# Patient Record
Sex: Male | Born: 1955
Health system: Southern US, Community
[De-identification: ages and names within clinical notes are randomized; demographics above are authoritative.]

## PROBLEM LIST (undated history)

## (undated) DIAGNOSIS — C4491 Basal cell carcinoma of skin, unspecified: Secondary | ICD-10-CM

## (undated) DIAGNOSIS — L52 Erythema nodosum: Secondary | ICD-10-CM

## (undated) DIAGNOSIS — H16139 Photokeratitis, unspecified eye: Secondary | ICD-10-CM

## (undated) HISTORY — DX: Photokeratitis, unspecified eye: H16.139

## (undated) HISTORY — DX: Basal cell carcinoma of skin, unspecified: C44.91

## (undated) HISTORY — DX: Erythema nodosum: L52

## (undated) HISTORY — PX: REFRACTIVE SURGERY: SHX103

---

## 1999-09-14 ENCOUNTER — Ambulatory Visit (HOSPITAL_COMMUNITY): Admission: RE | Admit: 1999-09-14 | Discharge: 1999-09-14 | Payer: Self-pay | Admitting: Gastroenterology

## 2005-03-15 ENCOUNTER — Ambulatory Visit: Payer: Self-pay | Admitting: Internal Medicine

## 2005-03-27 ENCOUNTER — Ambulatory Visit: Payer: Self-pay | Admitting: Internal Medicine

## 2009-02-09 ENCOUNTER — Telehealth: Payer: Self-pay | Admitting: Internal Medicine

## 2009-03-09 ENCOUNTER — Telehealth (INDEPENDENT_AMBULATORY_CARE_PROVIDER_SITE_OTHER): Payer: Self-pay | Admitting: *Deleted

## 2009-03-17 ENCOUNTER — Ambulatory Visit: Payer: Self-pay | Admitting: Internal Medicine

## 2009-03-20 ENCOUNTER — Encounter: Payer: Self-pay | Admitting: Internal Medicine

## 2009-03-20 LAB — CONVERTED CEMR LAB
ALT: 19 units/L (ref 0–53)
AST: 32 units/L (ref 0–37)
Albumin: 4.4 g/dL (ref 3.5–5.2)
Alkaline Phosphatase: 63 units/L (ref 39–117)
BUN: 10 mg/dL (ref 6–23)
Basophils Absolute: 0.1 10*3/uL (ref 0.0–0.1)
Basophils Relative: 0.9 % (ref 0.0–3.0)
Bilirubin, Direct: 0.1 mg/dL (ref 0.0–0.3)
CO2: 31 meq/L (ref 19–32)
Calcium: 9.3 mg/dL (ref 8.4–10.5)
Chloride: 104 meq/L (ref 96–112)
Cholesterol: 167 mg/dL (ref 0–200)
Creatinine, Ser: 1.1 mg/dL (ref 0.4–1.5)
Eosinophils Absolute: 0.5 10*3/uL (ref 0.0–0.7)
Eosinophils Relative: 9.5 % — ABNORMAL HIGH (ref 0.0–5.0)
GFR calc non Af Amer: 74.63 mL/min (ref >60)
Glucose, Bld: 86 mg/dL (ref 70–99)
HCT: 41.8 % (ref 39.0–52.0)
HDL: 28.8 mg/dL — ABNORMAL LOW (ref >39.00)
Hemoglobin: 14.1 g/dL (ref 13.0–17.0)
LDL Cholesterol: 115 mg/dL — ABNORMAL HIGH (ref 0–99)
Lymphocytes Relative: 30.8 % (ref 12.0–46.0)
Lymphs Abs: 1.7 10*3/uL (ref 0.7–4.0)
MCHC: 33.7 g/dL (ref 30.0–36.0)
MCV: 89.2 fL (ref 78.0–100.0)
Monocytes Absolute: 0.5 10*3/uL (ref 0.1–1.0)
Monocytes Relative: 8.3 % (ref 3.0–12.0)
Neutro Abs: 2.8 10*3/uL (ref 1.4–7.7)
Neutrophils Relative %: 50.5 % (ref 43.0–77.0)
PSA: 0.46 ng/mL (ref 0.10–4.00)
Platelets: 226 10*3/uL (ref 150.0–400.0)
Potassium: 4.1 meq/L (ref 3.5–5.1)
RBC: 4.69 M/uL (ref 4.22–5.81)
RDW: 12 % (ref 11.5–14.6)
Sodium: 140 meq/L (ref 135–145)
TSH: 1.04 microintl units/mL (ref 0.35–5.50)
Total Bilirubin: 1 mg/dL (ref 0.3–1.2)
Total CHOL/HDL Ratio: 6
Total Protein: 7.5 g/dL (ref 6.0–8.3)
Triglycerides: 118 mg/dL (ref 0.0–149.0)
VLDL: 23.6 mg/dL (ref 0.0–40.0)
WBC: 5.6 10*3/uL (ref 4.5–10.5)

## 2009-03-24 ENCOUNTER — Ambulatory Visit: Payer: Self-pay | Admitting: Internal Medicine

## 2009-03-24 DIAGNOSIS — H9319 Tinnitus, unspecified ear: Secondary | ICD-10-CM | POA: Insufficient documentation

## 2009-03-27 ENCOUNTER — Encounter (INDEPENDENT_AMBULATORY_CARE_PROVIDER_SITE_OTHER): Payer: Self-pay | Admitting: *Deleted

## 2009-04-25 ENCOUNTER — Ambulatory Visit: Payer: Self-pay | Admitting: Internal Medicine

## 2009-04-26 LAB — CONVERTED CEMR LAB: Fecal Occult Bld: NEGATIVE

## 2009-04-27 ENCOUNTER — Encounter (INDEPENDENT_AMBULATORY_CARE_PROVIDER_SITE_OTHER): Payer: Self-pay | Admitting: *Deleted

## 2010-07-08 ENCOUNTER — Encounter: Payer: Self-pay | Admitting: Internal Medicine

## 2010-07-11 ENCOUNTER — Encounter: Payer: Self-pay | Admitting: Internal Medicine

## 2010-07-11 ENCOUNTER — Other Ambulatory Visit: Payer: Self-pay | Admitting: Internal Medicine

## 2010-07-11 ENCOUNTER — Ambulatory Visit
Admission: RE | Admit: 2010-07-11 | Discharge: 2010-07-11 | Payer: Self-pay | Source: Home / Self Care | Attending: Internal Medicine | Admitting: Internal Medicine

## 2010-07-11 DIAGNOSIS — R509 Fever, unspecified: Secondary | ICD-10-CM | POA: Insufficient documentation

## 2010-07-11 LAB — CBC WITH DIFFERENTIAL/PLATELET
Basophils Absolute: 0 10*3/uL (ref 0.0–0.1)
Basophils Relative: 0 % (ref 0.0–3.0)
Eosinophils Absolute: 0.2 10*3/uL (ref 0.0–0.7)
Eosinophils Relative: 1.6 % (ref 0.0–5.0)
HCT: 40.5 % (ref 39.0–52.0)
Hemoglobin: 13.4 g/dL (ref 13.0–17.0)
Lymphocytes Relative: 3.7 % — ABNORMAL LOW (ref 12.0–46.0)
Lymphs Abs: 0.6 10*3/uL — ABNORMAL LOW (ref 0.7–4.0)
MCHC: 33.2 g/dL (ref 30.0–36.0)
MCV: 91 fl (ref 78.0–100.0)
Monocytes Absolute: 1.6 10*3/uL — ABNORMAL HIGH (ref 0.1–1.0)
Monocytes Relative: 10 % (ref 3.0–12.0)
Neutro Abs: 13.3 10*3/uL — ABNORMAL HIGH (ref 1.4–7.7)
Neutrophils Relative %: 84.7 % — ABNORMAL HIGH (ref 43.0–77.0)
Platelets: 321 10*3/uL (ref 150.0–400.0)
RBC: 4.45 Mil/uL (ref 4.22–5.81)
RDW: 12.9 % (ref 11.5–14.6)
WBC: 15.7 10*3/uL — ABNORMAL HIGH (ref 4.5–10.5)

## 2010-07-11 LAB — CONVERTED CEMR LAB
Bilirubin Urine: NEGATIVE
Blood in Urine, dipstick: NEGATIVE
Glucose, Urine, Semiquant: NEGATIVE
Ketones, urine, test strip: NEGATIVE
Nitrite: NEGATIVE
Protein, U semiquant: NEGATIVE
Specific Gravity, Urine: >=1.03
Urobilinogen, UA: 0.2
WBC Urine, dipstick: NEGATIVE
pH: 6

## 2010-07-11 LAB — SEDIMENTATION RATE: Sed Rate: 42 mm/hr — ABNORMAL HIGH (ref 0–22)

## 2010-07-12 ENCOUNTER — Encounter: Payer: Self-pay | Admitting: Internal Medicine

## 2010-07-12 ENCOUNTER — Encounter: Payer: Self-pay | Admitting: Infectious Diseases

## 2010-07-12 ENCOUNTER — Ambulatory Visit
Admission: RE | Admit: 2010-07-12 | Discharge: 2010-07-12 | Payer: Self-pay | Source: Home / Self Care | Attending: Infectious Diseases | Admitting: Infectious Diseases

## 2010-07-12 LAB — CONVERTED CEMR LAB
ALT: 62 units/L — ABNORMAL HIGH (ref 0–53)
AST: 61 units/L — ABNORMAL HIGH (ref 0–37)
Albumin: 3.6 g/dL (ref 3.5–5.2)
Alkaline Phosphatase: 147 units/L — ABNORMAL HIGH (ref 39–117)
BUN: 14 mg/dL (ref 6–23)
Bacteria, UA: NONE SEEN
Basophils Absolute: 0 10*3/uL (ref 0.0–0.1)
Basophils Relative: 0 % (ref 0–1)
CMV IgM: 0.12 (ref <0.90)
CO2: 28 meq/L (ref 19–32)
Calcium: 8.6 mg/dL (ref 8.4–10.5)
Casts: NONE SEEN /lpf
Chloride: 96 meq/L (ref 96–112)
Creatinine, Ser: 0.93 mg/dL (ref 0.40–1.50)
Crystals: NONE SEEN
Cytomegalovirus Ab-IgG: 0.16 (ref <0.90)
Eosinophils Absolute: 0.6 10*3/uL (ref 0.0–0.7)
Eosinophils Relative: 3 % (ref 0–5)
Glucose, Bld: 115 mg/dL — ABNORMAL HIGH (ref 70–99)
HCT: 38.4 % — ABNORMAL LOW (ref 39.0–52.0)
Hemoglobin: 12.6 g/dL — ABNORMAL LOW (ref 13.0–17.0)
Lymphocytes Relative: 4 % — ABNORMAL LOW (ref 12–46)
Lymphs Abs: 0.7 10*3/uL (ref 0.7–4.0)
MCHC: 32.8 g/dL (ref 30.0–36.0)
MCV: 88.3 fL (ref 78.0–100.0)
Monocytes Absolute: 1.8 10*3/uL — ABNORMAL HIGH (ref 0.1–1.0)
Monocytes Relative: 10 % (ref 3–12)
Neutro Abs: 14.8 10*3/uL — ABNORMAL HIGH (ref 1.7–7.7)
Neutrophils Relative %: 83 % — ABNORMAL HIGH (ref 43–77)
Platelets: 380 10*3/uL (ref 150–400)
Potassium: 4.4 meq/L (ref 3.5–5.3)
RBC: 4.35 M/uL (ref 4.22–5.81)
RDW: 13 % (ref 11.5–15.5)
Sodium: 135 meq/L (ref 135–145)
Squamous Epithelial / LPF: NONE SEEN /lpf
Total Bilirubin: 0.7 mg/dL (ref 0.3–1.2)
Total Protein: 6.5 g/dL (ref 6.0–8.3)
Toxoplasma Antibody- IgM: 0.2
Toxoplasma IgG Ratio: <0.5
WBC: 17.9 10*3/uL — ABNORMAL HIGH (ref 4.0–10.5)

## 2010-07-15 LAB — CONVERTED CEMR LAB
Anti Nuclear Antibody(ANA): NEGATIVE
Rhuematoid fact SerPl-aCnc: 31 intl units/mL — ABNORMAL HIGH (ref <=14)

## 2010-07-18 ENCOUNTER — Telehealth: Payer: Self-pay | Admitting: Infectious Diseases

## 2010-07-19 ENCOUNTER — Ambulatory Visit
Admission: RE | Admit: 2010-07-19 | Discharge: 2010-07-19 | Payer: Self-pay | Source: Home / Self Care | Attending: Infectious Diseases | Admitting: Infectious Diseases

## 2010-07-19 ENCOUNTER — Encounter: Payer: Self-pay | Admitting: Infectious Diseases

## 2010-07-19 ENCOUNTER — Encounter: Payer: Self-pay | Admitting: Internal Medicine

## 2010-07-19 ENCOUNTER — Inpatient Hospital Stay (HOSPITAL_COMMUNITY)
Admission: EM | Admit: 2010-07-19 | Discharge: 2010-07-24 | Disposition: A | Discharge status: Home / Self Care | Payer: BC Managed Care – PPO | Source: Home / Self Care | Attending: Internal Medicine | Admitting: Internal Medicine

## 2010-07-19 LAB — DIFFERENTIAL
Basophils Absolute: 0 10*3/uL (ref 0.0–0.1)
Basophils Relative: 0 % (ref 0–1)
Eosinophils Absolute: 1 10*3/uL — ABNORMAL HIGH (ref 0.0–0.7)
Eosinophils Relative: 4 % (ref 0–5)
Lymphocytes Relative: 4 % — ABNORMAL LOW (ref 12–46)
Lymphs Abs: 1 10*3/uL (ref 0.7–4.0)
Monocytes Absolute: 2.6 10*3/uL — ABNORMAL HIGH (ref 0.1–1.0)
Monocytes Relative: 11 % (ref 3–12)
Neutro Abs: 19.4 10*3/uL — ABNORMAL HIGH (ref 1.7–7.7)
Neutrophils Relative %: 81 % — ABNORMAL HIGH (ref 43–77)

## 2010-07-19 LAB — COMPREHENSIVE METABOLIC PANEL
ALT: 49 U/L (ref 0–53)
AST: 49 U/L — ABNORMAL HIGH (ref 0–37)
Albumin: 2.3 g/dL — ABNORMAL LOW (ref 3.5–5.2)
Alkaline Phosphatase: 329 U/L — ABNORMAL HIGH (ref 39–117)
BUN: 11 mg/dL (ref 6–23)
CO2: 29 mEq/L (ref 19–32)
Calcium: 8.2 mg/dL — ABNORMAL LOW (ref 8.4–10.5)
Chloride: 101 mEq/L (ref 96–112)
Creatinine, Ser: 0.86 mg/dL (ref 0.4–1.5)
GFR calc Af Amer: >60 mL/min (ref >60)
GFR calc non Af Amer: >60 mL/min (ref >60)
Glucose, Bld: 91 mg/dL (ref 70–99)
Potassium: 4.5 mEq/L (ref 3.5–5.1)
Sodium: 141 mEq/L (ref 135–145)
Total Bilirubin: 0.4 mg/dL (ref 0.3–1.2)
Total Protein: 6.3 g/dL (ref 6.0–8.3)

## 2010-07-19 LAB — CK TOTAL AND CKMB (NOT AT ARMC)
CK, MB: 0.6 ng/mL (ref 0.3–4.0)
Relative Index: INVALID (ref 0.0–2.5)
Total CK: 21 U/L (ref 7–232)

## 2010-07-19 LAB — CBC
HCT: 35.1 % — ABNORMAL LOW (ref 39.0–52.0)
Hemoglobin: 11.4 g/dL — ABNORMAL LOW (ref 13.0–17.0)
MCH: 28.1 pg (ref 26.0–34.0)
MCHC: 32.5 g/dL (ref 30.0–36.0)
MCV: 86.5 fL (ref 78.0–100.0)
Platelets: 685 10*3/uL — ABNORMAL HIGH (ref 150–400)
RBC: 4.06 MIL/uL — ABNORMAL LOW (ref 4.22–5.81)
RDW: 14 % (ref 11.5–15.5)
WBC: 24 10*3/uL — ABNORMAL HIGH (ref 4.0–10.5)

## 2010-07-19 LAB — RHEUMATOID FACTOR: Rhuematoid fact SerPl-aCnc: 25 IU/mL — ABNORMAL HIGH (ref <=14)

## 2010-07-19 LAB — PROTIME-INR
INR: 1.27 (ref 0.00–1.49)
Prothrombin Time: 16.1 seconds — ABNORMAL HIGH (ref 11.6–15.2)

## 2010-07-19 LAB — APTT: aPTT: 31 seconds (ref 24–37)

## 2010-07-19 LAB — FOLATE: Folate: 8.1 ng/mL

## 2010-07-19 LAB — RPR: RPR Ser Ql: NONREACTIVE

## 2010-07-19 LAB — FERRITIN: Ferritin: 522 ng/mL — ABNORMAL HIGH (ref 22–322)

## 2010-07-19 LAB — VITAMIN B12: Vitamin B-12: 669 pg/mL (ref 211–911)

## 2010-07-19 LAB — ANTISTREPTOLYSIN O TITER: ASO: 55 IU/mL (ref 0–116)

## 2010-07-19 LAB — LACTATE DEHYDROGENASE: LDH: 159 U/L (ref 94–250)

## 2010-07-19 LAB — BRAIN NATRIURETIC PEPTIDE: Pro B Natriuretic peptide (BNP): 113 pg/mL — ABNORMAL HIGH (ref 0.0–100.0)

## 2010-07-19 NOTE — H&P (Addendum)
Jeremiah [name], Jeremiah [name]            ACCOUNT NO.:  [id number]  MEDICAL RECORD NO.:  [id number]          PATIENT TYPE:  INP  LOCATION:  2009                         FACILITY:  [facility]  PHYSICIAN:  Hartley Barefoot, MD    DATE OF BIRTH:  04-11-1956  DATE OF ADMISSION:  07/19/2010 DATE OF DISCHARGE:                             HISTORY & PHYSICAL   PRIMARY CARE PHYSICIAN:  Willow Ora, MD  INFECTIOUS DISEASES SPECIALIST:  Lacretia Leigh. Ninetta Lights, MD.  CHIEF COMPLAINT:  Fever, skin knot, and lower extremity swelling.  HISTORY OF PRESENT ILLNESS:  This is a very pleasant 55 year old with no significant past medical history who was referred for a direct admission by his infectious diseases specialist, Dr. Ninetta Lights due to fever for 2 weeks and lower extremity swelling.  Mr. Canavan relate that at the beginning of the month.  He started to have a knot on his head.  He felt a small induration painful, then over then course of 4 days, he developed multiple knots on his chest and right arm.  He also had  other on his right thigh.  He described the knot as induration, swelling, purplish, reddish initially and then purplish.  He relate that knot decreased in size and at this time, they went away.  He only had a knot on his right thigh and they did a biopsy today.  He went to [facility] to have the biopsy done.  He also relates that over the last 2 weeks, he has had fever in the 100 and 101.  He also relates night sweats.  He relate lower extremity pain over he last couple of days.  He has also noticed lower extremity edema over the last couple of days.  He denies chest pain, shortness of breath, nausea, vomiting, diarrhea, cough, or sore throat.  He relates fatigue since 2 weeks ago.  He denies joint pain.   He relates that he has been taking ibuprofen 200 mg every 6 hours for fevers and for couple of days, he took ibuprofen every 3 hours.  He has been also taking Tylenol for fever.  He travel to  [place]  in august and december. No sick contact.  No family history of malignancy.  ALLERGIES:  No known drug allergy.  MEDICATIONS: 1. Ibuprofen 200 mg q.6 h. 2. Tylenol.  PAST MEDICAL HISTORY:  None.  PAST SURGICAL HISTORY:  None.  SOCIAL HISTORY:  He is married.  He has two children, they are healthy. He works as a Development worker, international aid, Medical illustrator.  He never smoked. Alcohol socially.  No recreational drugs.  REVIEW OF SYSTEMS:  Negative except as per HPI.  FAMILY HISTORY:  Father died, had a history of Parkinson disease, dementia and MI.  No family history of cancer.  Mother 24 year old, she has arthritis and carotid artery disease.  He has one brother that is healthy.  PHYSICAL EXAMINATION:  VITAL SIGNS:  Temperature 98.5, pulse 94, respirations 18, blood pressure 119/76, sat 100% on room air. GENERAL:  The patient is lying in bed, in no acute distress. HEENT:  Head traumatic, normocephalic.  Eyes anicteric.  Pupil equal, reactive to light.  Mucous membrane moist. NECK:  Supple.  No thyromegaly.  No nuchal rigidity. CARDIOVASCULAR:  S1, S2, normal regular rhythm and rate.  No rubs, murmurs or gallops. LUNGS:  Bilateral good air movement.  No wheezing, crackles or rhonchi. EXTREMITIES:  Pulse positive bilateral, +2 edema from below-the-knee. NEURO:  Alert and oriented x3.  Cranial nerves II-XII intact.  Sensation grossly intact.  Motor strength 5/5 throughout. SKIN:  No rashes.  No lymphadenopathy.  He has a dressing where he had the skin biopsy.  All others knot has fading away.  LABS:  BNP 113, LDH 159, total CK 21.  White blood cell 24, hemoglobin 11.4, platelet 685, hematocrit 35, absolutely neutrophil 19.  No other labs available.  ASSESSMENT AND PLAN:  This is a very pleasant 55 year old with no significant past medical history who presents with two episodes of fever, skin nodules, night sweats and lower extremity edema. 1. Fever and night sweats, differential  is broad, infectious,     rheumatologic disease or malignancy.  The patient had a     rheumatology workup with a negative ANA and rheumatoid factor.  He     had a blood cultures done wich report by Dr. Ninetta Lights has showed no     growth.  Malignancy is highly the differential.  We will get CT     abdomen, pelvis and chest to rule out lymphoma and we will get the     test if kidney function is within normal limits.  I will also check     RPR, peripheral smear.  LDH was normal at 159.  He had a prior     chest x-ray that showed no acute cardiopulmonary process.  I will     get urinalysis and urine cultures. 2. Skin nodule versus lymphadenopathy.  By patient description, this     could be erythema nodosum. Skin nodule biopsy is pending.       If this is erythema nodosum, differential is broad again, malignancy      lymphoma infectious process. I will check ASO titer, RPR, double-strand DNA.      CT Chest to  check for any lymphadenopathy in the case of sarcoidosis.       Further workup depending on biopsy results. 3. History Transaminases. I spoke with Dr Ninetta Lights and patient had increase     LFT test in the 50 range.I will repeat liver function test.  I will check     viral hepatitis panel. 4. Lower extremity edema.  Differential liver failure, kidney failure,     heart failure, hypoglobulinemia. less likely BL LE DVTs. I     will check CMET, urinalysis, a 2-D echo.  Further workup depending     on test result. Will consider doppler.  5. Normocytic anemia.  I will check anemia panel. 6. Leukocytosis.  Blood cultures pending.  will check CT abdomen,     pelvis, chest to check for malignancy and infectious process.  At     this time, the patient vital signs are within normal limits. We will     monitor closely vitals for sign of sepsis. Will consider antibiotics      depending on work up results. 7.  For DVT prophylaxis, heparin and if kidney function is normal.      We can consider changing  to Lovenox.     Hartley Barefoot, MD     BR/MEDQ  D:  07/19/2010  T:  07/19/2010  Job:  [id number]  Electronically Signed  by Hartley Barefoot MD on 07/19/2010 10:57:40 PM

## 2010-07-20 LAB — MRSA PCR SCREENING: MRSA by PCR: NEGATIVE

## 2010-07-20 LAB — IRON AND TIBC
Iron: <10 ug/dL — ABNORMAL LOW (ref 42–135)
UIBC: 246 ug/dL

## 2010-07-20 LAB — ANTI-DNA ANTIBODY, DOUBLE-STRANDED: ds DNA Ab: 1 IU/mL (ref <30)

## 2010-07-20 LAB — HEPATITIS PANEL, ACUTE
HCV Ab: NEGATIVE
Hep A IgM: NEGATIVE
Hep B C IgM: NEGATIVE
Hepatitis B Surface Ag: NEGATIVE

## 2010-07-20 LAB — URINE CULTURE
Colony Count: NO GROWTH
Culture  Setup Time: 201201261700
Culture: NO GROWTH

## 2010-07-20 LAB — CBC
HCT: 30.7 % — ABNORMAL LOW (ref 39.0–52.0)
Hemoglobin: 10.2 g/dL — ABNORMAL LOW (ref 13.0–17.0)
MCH: 28.3 pg (ref 26.0–34.0)
MCHC: 33.2 g/dL (ref 30.0–36.0)
MCV: 85.3 fL (ref 78.0–100.0)
Platelets: 598 10*3/uL — ABNORMAL HIGH (ref 150–400)
RBC: 3.6 MIL/uL — ABNORMAL LOW (ref 4.22–5.81)
RDW: 14.1 % (ref 11.5–15.5)
WBC: 20.1 10*3/uL — ABNORMAL HIGH (ref 4.0–10.5)

## 2010-07-20 LAB — COMPREHENSIVE METABOLIC PANEL
ALT: 46 U/L (ref 0–53)
AST: 46 U/L — ABNORMAL HIGH (ref 0–37)
Albumin: 2 g/dL — ABNORMAL LOW (ref 3.5–5.2)
Alkaline Phosphatase: 291 U/L — ABNORMAL HIGH (ref 39–117)
BUN: 15 mg/dL (ref 6–23)
CO2: 28 mEq/L (ref 19–32)
Calcium: 8.1 mg/dL — ABNORMAL LOW (ref 8.4–10.5)
Chloride: 102 mEq/L (ref 96–112)
Creatinine, Ser: 1.06 mg/dL (ref 0.4–1.5)
GFR calc Af Amer: >60 mL/min (ref >60)
GFR calc non Af Amer: >60 mL/min (ref >60)
Glucose, Bld: 102 mg/dL — ABNORMAL HIGH (ref 70–99)
Potassium: 4.4 mEq/L (ref 3.5–5.1)
Sodium: 139 mEq/L (ref 135–145)
Total Bilirubin: 0.4 mg/dL (ref 0.3–1.2)
Total Protein: 5.6 g/dL — ABNORMAL LOW (ref 6.0–8.3)

## 2010-07-21 LAB — URINALYSIS, ROUTINE W REFLEX MICROSCOPIC
Bilirubin Urine: NEGATIVE
Hgb urine dipstick: NEGATIVE
Ketones, ur: NEGATIVE mg/dL
Nitrite: NEGATIVE
Protein, ur: NEGATIVE mg/dL
Specific Gravity, Urine: 1.026 (ref 1.005–1.030)
Urine Glucose, Fasting: NEGATIVE mg/dL
Urobilinogen, UA: 2 mg/dL — ABNORMAL HIGH (ref 0.0–1.0)
pH: 6 (ref 5.0–8.0)

## 2010-07-21 LAB — CBC
HCT: 33.4 % — ABNORMAL LOW (ref 39.0–52.0)
Hemoglobin: 11.2 g/dL — ABNORMAL LOW (ref 13.0–17.0)
MCH: 28 pg (ref 26.0–34.0)
MCHC: 33.5 g/dL (ref 30.0–36.0)
MCV: 83.5 fL (ref 78.0–100.0)
Platelets: 685 10*3/uL — ABNORMAL HIGH (ref 150–400)
RBC: 4 MIL/uL — ABNORMAL LOW (ref 4.22–5.81)
RDW: 13.9 % (ref 11.5–15.5)
WBC: 21.9 10*3/uL — ABNORMAL HIGH (ref 4.0–10.5)

## 2010-07-21 LAB — HIV ANTIBODY (ROUTINE TESTING W REFLEX): HIV: NONREACTIVE

## 2010-07-22 LAB — CBC
HCT: 32.8 % — ABNORMAL LOW (ref 39.0–52.0)
Hemoglobin: 10.7 g/dL — ABNORMAL LOW (ref 13.0–17.0)
MCH: 28 pg (ref 26.0–34.0)
MCHC: 32.6 g/dL (ref 30.0–36.0)
MCV: 85.9 fL (ref 78.0–100.0)
Platelets: 658 10*3/uL — ABNORMAL HIGH (ref 150–400)
RBC: 3.82 MIL/uL — ABNORMAL LOW (ref 4.22–5.81)
RDW: 14.1 % (ref 11.5–15.5)
WBC: 23.2 10*3/uL — ABNORMAL HIGH (ref 4.0–10.5)

## 2010-07-22 LAB — BASIC METABOLIC PANEL
BUN: 10 mg/dL (ref 6–23)
CO2: 28 mEq/L (ref 19–32)
Calcium: 8.2 mg/dL — ABNORMAL LOW (ref 8.4–10.5)
Chloride: 101 mEq/L (ref 96–112)
Creatinine, Ser: 0.95 mg/dL (ref 0.4–1.5)
GFR calc Af Amer: >60 mL/min (ref >60)
GFR calc non Af Amer: >60 mL/min (ref >60)
Glucose, Bld: 98 mg/dL (ref 70–99)
Potassium: 4.2 mEq/L (ref 3.5–5.1)
Sodium: 137 mEq/L (ref 135–145)

## 2010-07-22 LAB — MAGNESIUM: Magnesium: 2.3 mg/dL (ref 1.5–2.5)

## 2010-07-23 ENCOUNTER — Ambulatory Visit: Admit: 2010-07-23 | Payer: Self-pay | Admitting: Infectious Diseases

## 2010-07-23 LAB — CYCLIC CITRUL PEPTIDE ANTIBODY, IGG
Cyclic Citrullin Peptide Ab: <2 U/mL (ref 0.0–5.0)
Cyclic Citrullin Peptide Ab: <2 U/mL (ref 0.0–5.0)

## 2010-07-24 DIAGNOSIS — R509 Fever, unspecified: Secondary | ICD-10-CM

## 2010-07-24 LAB — COMPREHENSIVE METABOLIC PANEL
ALT: 66 U/L — ABNORMAL HIGH (ref 0–53)
AST: 74 U/L — ABNORMAL HIGH (ref 0–37)
Albumin: 1.7 g/dL — ABNORMAL LOW (ref 3.5–5.2)
Alkaline Phosphatase: 261 U/L — ABNORMAL HIGH (ref 39–117)
BUN: 11 mg/dL (ref 6–23)
CO2: 27 mEq/L (ref 19–32)
Calcium: 8.1 mg/dL — ABNORMAL LOW (ref 8.4–10.5)
Chloride: 104 mEq/L (ref 96–112)
Creatinine, Ser: 0.97 mg/dL (ref 0.4–1.5)
GFR calc Af Amer: >60 mL/min (ref >60)
GFR calc non Af Amer: >60 mL/min (ref >60)
Glucose, Bld: 105 mg/dL — ABNORMAL HIGH (ref 70–99)
Potassium: 4.4 mEq/L (ref 3.5–5.1)
Sodium: 139 mEq/L (ref 135–145)
Total Bilirubin: 0.3 mg/dL (ref 0.3–1.2)
Total Protein: 5.4 g/dL — ABNORMAL LOW (ref 6.0–8.3)

## 2010-07-24 LAB — CBC
HCT: 30 % — ABNORMAL LOW (ref 39.0–52.0)
Hemoglobin: 9.9 g/dL — ABNORMAL LOW (ref 13.0–17.0)
MCH: 28.2 pg (ref 26.0–34.0)
MCHC: 33 g/dL (ref 30.0–36.0)
MCV: 85.5 fL (ref 78.0–100.0)
Platelets: 691 10*3/uL — ABNORMAL HIGH (ref 150–400)
RBC: 3.51 MIL/uL — ABNORMAL LOW (ref 4.22–5.81)
RDW: 14.3 % (ref 11.5–15.5)
WBC: 20.6 10*3/uL — ABNORMAL HIGH (ref 4.0–10.5)

## 2010-07-24 NOTE — Letter (Signed)
Summary: Cancer Screening/[facility] Personalized Risk Profile  Cancer Screening/Me [facility] Personalized Risk Profile   Imported By: Lanelle Bal 03/31/2009 09:38:37  _____________________________________________________________________  External Attachment:    Type:   Image     Comment:   External Document

## 2010-07-26 LAB — CULTURE, BLOOD (SINGLE)
Culture  Setup Time: 201201272148
Culture: NO GROWTH

## 2010-07-26 NOTE — Assessment & Plan Note (Signed)
Summary: roa per pt//lch   Vital Signs:  Patient profile:   55 year old male Height:      72 inches Weight:      178.25 pounds BMI:     24.26 Temp:     99.7 degrees F oral Pulse rate:   78 / minute Pulse rhythm:   regular BP sitting:   130 / 86  (left arm) Cuff size:   large  Vitals Entered By: Army Fossa CMA (July 11, 2010 8:34 AM) CC: Pt here 2 weeks ago devolped knot on head, 1 week ago developed knots on chest and abdomen. Comments Fever since Thursday Went to [facility] on door w/ chart summary [facility] [address]- not fasting    History of Present Illness: 2 weeks ago, developed a knot on his head (R nuchal area), since then he has also developed similar problems in the R upper chest, L upper chest ,  left upper abdomen and today at the right arm. The spots are usually tender and slightly red. The one in the head  is already resorbed 7days ago developed fever, the fever is debilitating, steady not cyclical, decreased with Tylenol. Went to urgent care 3 days ago, a CBC reportedly was normal, BMP LFTs normal. EBV  antibodies negative, Lyme test negative as well. RMSF pending. he is here for further evaluation.     Current Medications (verified): 1)  None  Allergies (verified): No Known Drug Allergies  Past History:  Past Medical History: Reviewed history from 03/24/2009 and no changes required.  remote history of hemorrhoids  Past Surgical History: Reviewed history from 03/24/2009 and no changes required. lasik surgery 2007  Social History: Reviewed history from 03/24/2009 and no changes required. goes by "Jeremiah [name]" Married 2 children Occupation: Agricultural consultant, Medical illustrator Never Smoked Alcohol use-no Drug use-no Regular exercise-yes (3x/wk)  Review of Systems General:  Denies weight loss; foreign travel-- [place] x 2 within last year , was in the wild x 1 day  tick bites , several last year no other people w/  similar symptoms    no new meds   . Eyes:  (-) conjuntivitis . ENT:  Denies sinus pressure and sore throat. Resp:  Denies cough, shortness of breath, and wheezing. GI:  Denies diarrhea, nausea, and vomiting. GU:  Denies dysuria and hematuria. MS:  "jaw muscle sore" no other myalgias  jount aches only w/ fever. Derm:  Denies rash. Neuro:  Denies headaches. Heme:  Denies bleeding.  Physical Exam  General:  alert, well-developed, and well-nourished.  nontoxic   Eyes:  no conjuntival lesions  Neck:  full ROM, no masses, and no thyromegaly.  full ROM, no masses, and no thyromegaly.   Lungs:  normal respiratory effort, no intercostal retractions, no accessory muscle use, and normal breath sounds.    Heart:  normal rate, regular rhythm, no murmur, and no gallop.    Abdomen:  soft, non-tender, no distention, no guarding, and no rigidity.   Extremities:  inspection and palpation both wrists and hands show no synovitis no nail lesions  Skin:  nuchal area--no mass or redness R chest--has some macular redness at the area where he used to have a no L upper chest-- small  ~ 1cm  s.q. knot, slightly  tender L upper abd-- irregular shape, 2 cm, s.q. mass, soft slightly  tender, no redness R arm -- 1cm s.q. mass Cervical Nodes:  no Axillary Nodes:  1 cm lymph node on the right, none on the  L Inguinal Nodes:  no pathologic lymph nodes   Impression & Recommendations:  Problem # 1:  FEVER UNSPECIFIED (ICD-780.60)  febrile illness for 2 weeks associated with what seems to be paniculitis  No arthralgias, no synovitis, no headaches, no murmur. etiology unclear Plan: Blood cultures, CBC, Sedimentation rate Chest x-ray Referral to ID ER if symptoms severe, rash or headaches  Orders: Venipuncture (16109) TLB-CBC Platelet - w/Differential (85025-CBCD) TLB-Sedimentation Rate (ESR) (85652-ESR) T- * Misc. Laboratory test [phone]) T-2 View CXR, Same Day (71020.5TC) T-Antinuclear Antib (ANA)  [phone]) T-Rheumatoid Factor [phone]) UA Dipstick w/o Micro (automated)  (81003) T-Culture, Urine ([id number]) T-Urine Microscopic (28413-24401) Infectious Disease Referral (ID) Specimen Handling (02725)   Orders Added: 1)  Venipuncture [36644] 2)  TLB-CBC Platelet - w/Differential [85025-CBCD] 3)  TLB-Sedimentation Rate (ESR) [85652-ESR] 4)  T- * Misc. Laboratory test [99999] 5)  T-2 View CXR, Same Day [71020.5TC] 6)  T-Antinuclear Antib (ANA) [[id number]] 7)  T-Rheumatoid Factor [phone] 8)  UA Dipstick w/o Micro (automated)  [81003] 9)  T-Culture, Urine [[id number]] 10)  T-Urine Microscopic [30160-10932] 11)  Infectious Disease Referral [ID] 12)  Specimen Handling [99000] 13)  Est. Patient Level IV [35573]    Laboratory Results   Urine Tests    Routine Urinalysis   Color: yellow Appearance: Clear Glucose: negative   (Normal Range: Negative) Bilirubin: negative   (Normal Range: Negative) Ketone: negative   (Normal Range: Negative) Spec. Gravity: >=1.030   (Normal Range: 1.003-1.035) Blood: negative   (Normal Range: Negative) pH: 6.0   (Normal Range: 5.0-8.0) Protein: negative   (Normal Range: Negative) Urobilinogen: 0.2   (Normal Range: 0-1) Nitrite: negative   (Normal Range: Negative) Leukocyte Esterace: negative   (Normal Range: Negative)    Comments: Army Fossa CMA  July 11, 2010 9:11 AM

## 2010-07-26 NOTE — Progress Notes (Signed)
Summary: severe muscular leg pain in the AM  Phone Note Call from Patient Call back at [phone]  cell   Caller: Patient Call For: Johny Sax MD Reason for Call: Talk to Doctor Summary of Call: Message left explaining current symptoms.  Severe leg pain, especially in the AM where he has difficulty navigating the stairs.   Pain feels like "muscle aches."  Pt. wondering if this could be related to "blood clots."  Would appreciate response by email or phone. Jennet Maduro RN  July 18, 2010 4:57 PM   Follow-up for Phone Call        pt seen in clinic 1-27, adm to hospital

## 2010-07-26 NOTE — Assessment & Plan Note (Addendum)
Summary: new pt fevers since 1/12/white ct high   CC:  knot on back of head 2 wks ago, then another one showed up 5 days.  Has ahd knots on different parts of his body.  Today, both jaws are swollen as well.  Started having fevers last Friday, and Jan 13th.  Fever up to 102.8 this morning.  Took Tylenol at noon today...  History of Present Illness: 55 yo M seen in urgent care early January 2012 and had EBV, RMSF and Lyme ( negative). WBC 5.6, TSH nl, ESR 42. CXR (-). Had knots on his chest and back of his head. These have come and gone transiently. Had fatigue. His temps have gone up consistently since this, up to 102 this AM. Has tenderness in his jaw, temples. No joint pain or swelling. Has muscle aching.  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    Preventive Screening-Counseling & Management  Alcohol-Tobacco     Alcohol drinks/day: <1     Smoking Status: never  Caffeine-Diet-Exercise     Caffeine use/day: yes     Does Patient Exercise: yes   Updated Prior Medication List: No Medications Current Allergies (reviewed today): No known allergies  Past History:  Past medical, surgical, family and social histories (including risk factors) reviewed, and no changes noted (except as noted below).  Past Medical History: Reviewed history from 03/24/2009 and no changes required.  remote history of hemorrhoids  Past Surgical History: Reviewed history from 03/24/2009 and no changes required. lasik surgery 2007  Family History: Reviewed history from 03/24/2009 and no changes required. F - deceased parkinson, dementia, high chol, CAD (late 22's) CAD-- F M - living 54y/o, TIA 2005, high cholesterol prostate Ca - no colon Ca - no DM - no  HTN - no dementia - P. Aunt x 2 , P uncle  Social History: Reviewed history from 03/24/2009 and no changes required. goes by "Moshe Cipro" Married 2 children Occupation: Agricultural consultant, Chief Executive Officer Never Smoked Alcohol use-no Drug use-no Regular exercise-yes (3x/wk), white [employer].   Review of Systems       no lymphadenopathy, wt steady, no joint pain or swelling, no rashes, nl BM, nl urination, no SOB or cough, no rash.   Vital Signs:  Patient profile:   55 year old male Height:      72 inches (182.88 cm) Weight:      178 pounds (80.91 kg) Temp:     100 degrees F (37.78 degrees C) oral Pulse rate:   92 / minute BP sitting:   112 / 68  (left arm) Cuff size:   large  Vitals Entered By: Jennet Maduro RN (July 12, 2010 3:29 PM) CC: knot on back of head 2 wks ago, then another one showed up 5 days.  Has ahd knots on different parts of his body.  Today, both jaws are swollen as well.  Started having fevers last Friday, Jan 13th.  Fever up to 102.8 this morning.  Took Tylenol at noon today.. Is Patient Diabetic? No Pain Assessment Patient in pain? yes     Location: legs aching  Intensity: 6 Type: aching Onset of pain  Constant Nutritional Status BMI of 19 -24 = normal Nutritional Status Detail appetite "i DON'T HAVE ONE TODAY>"  Have you ever been in a relationship where you felt threatened, hurt or afraid?not asked today   Does patient need assistance? Functional Status Self care Ambulation Normal  Physical Exam  General:  alert, well-developed, well-nourished, and well-hydrated.   Eyes:  pupils equal, pupils round, and pupils reactive to light.   Mouth:  pharynx pink and moist and no exudates.   Neck:  no masses.   Lungs:  normal respiratory effort and normal breath sounds.   Heart:  normal rate, regular rhythm, and no murmur.   Abdomen:  soft, non-tender, and normal bowel sounds.   Cervical Nodes:  no anterior cervical adenopathy and no posterior cervical adenopathy.   Axillary Nodes:  no R axillary adenopathy and no L axillary adenopathy.   Additional Exam:  no temporal artery tenderness.  no tenderness over spine. no joint swelling or  tenderness. There is a firm nodule on his L medial biceps area. it is non-tender.    Impression & Recommendations:  Problem # 1:  FEVER UNSPECIFIED (ICD-780.60)  His fever is elusive. He does not have risk factors for HIV/Hep B/C, Syphillis. It is out of season for tick borne illnesses, he has not had a fresh water exposure for leptospirosis, he does not own a cat. His rheumatology w/u is negative so far.  his swollen nodules are the only identifiable source. will send him to a general surgeon for a biopsy. Will check a CBC, Toxo, CMV serologies. If these are negaitve, will send him for CT scan chest/abd/pelvis. Will see him back after bx is done.   Orders: T-CMV IgM  Antibody ([id number]) T-CMV IgG Antibody ([id number]) T-Comprehensive Metabolic Panel (96295-28413) T-CBC w/Diff (24401-02725) T-Toxoplasma Antibody IgG (36644-03474) T-Toxoplasma Antibody IgM (25956-38756) Consultation Level IV (43329)  Appended Document: new pt fevers since 1/12/white ct high, surg. referral

## 2010-07-26 NOTE — Assessment & Plan Note (Signed)
Summary: f/u ov   CC:  muscle aches and fatigue.  History of Present Illness: 55 yo M seen in urgent care early January 2012 and had EBV, RMSF and Lyme ( negative). WBC 5.6, TSH nl, ESR 42. CXR (-). Had knots on his chest and back of his head. These have come and gone transiently. Had fatigue. His temps have gone up consistently since this, up to 102 this AM. Has tenderness in his jaw, temples. No joint pain or swelling. Has muscle aching. At his previous visit he had BCx and UCx (-), mild increase in LFTs, and WBC 17. His RF was 31 (elavated).   Since previous visit his fevers have persisted, having night sweats. This AM was seen in [facility] bx of RUE and LLE nondules,  and has been noted to have massive fluid retention in his legs. Has gained 6#.   Preventive Screening-Counseling & Management  Alcohol-Tobacco     Alcohol drinks/day: 0     Smoking Status: never   Updated Prior Medication List: No Medications Current Allergies (reviewed today): No known allergies  Review of Systems       no CP, no SAB  Vital Signs:  Patient profile:   55 year old male Height:      72 inches (182.88 cm) Temp:     99.4 degrees F (37.44 degrees C) oral Pulse rate:   85 / minute BP sitting:   106 / 70  (left arm)  Vitals Entered By: Starleen Arms CMA (July 19, 2010 11:36 AM) CC: muscle aches, fatigue Is Patient Diabetic? No Pain Assessment Patient in pain? no       Does patient need assistance? Functional Status Self care Ambulation Normal   Physical Exam  General:  well-developed, well-nourished, and well-hydrated.   Eyes:  pupils equal, pupils round, and pupils reactive to light.   Mouth:  pharynx pink and moist and no exudates.   Lungs:  normal respiratory effort, no intercostal retractions, and no accessory muscle use.   Heart:  normal rate, regular rhythm, and no murmur.   Abdomen:  soft, non-tender, and normal bowel sounds.     Impression &  Recommendations:  Problem # 1:  FEVER UNSPECIFIED (ICD-780.60)  He continues to do poorly- have myalgias, hight fevers and nights sweats. His increasing LE edema makes me wonder that he has obstructive process in his legs, pelvis or thorax. He does not have cardiac symptoms (although this does not rule this out). I explained options, possbile dx to him (especially concerned for Lymphoma) and asked him that he be admitted for completion of his w/u (CT chest/abd/pelvis, TTE, BCx, CMP, CBC, BNP, LDH, CPK). His Bx will processed through path which can be f/u in hospital.   Orders: T-CBC w/Diff [phone]) T-CK MB Fract ([address]) T-CK Total [phone]) T-Lactate Dehydrogenase (LDH) (65784-69629) T-BNP  (B Natriuretic Peptide) (52841-32440) T-Culture, Blood Routine (10272-53664) Est. Patient Level IV (40347)

## 2010-07-27 LAB — FUNGUS CULTURE, BLOOD: Culture: NO GROWTH

## 2010-07-29 LAB — CULTURE, BLOOD (ROUTINE X 2)
Culture  Setup Time: 201201262204
Culture  Setup Time: 201201262204
Culture: NO GROWTH
Culture: NO GROWTH

## 2010-08-01 ENCOUNTER — Ambulatory Visit (INDEPENDENT_AMBULATORY_CARE_PROVIDER_SITE_OTHER): Payer: BC Managed Care – PPO | Admitting: Infectious Diseases

## 2010-08-01 ENCOUNTER — Encounter: Payer: Self-pay | Admitting: Infectious Diseases

## 2010-08-01 DIAGNOSIS — L52 Erythema nodosum: Secondary | ICD-10-CM

## 2010-08-08 NOTE — Discharge Summary (Signed)
[name], [name]            ACCOUNT NO.:  [id number]  MEDICAL RECORD NO.:  [id number]          PATIENT TYPE:  INP  LOCATION:  2009                         FACILITY:  [facility]  PHYSICIAN:  Peggye Pitt, M.D. DATE OF BIRTH:  September 21, 1955  DATE OF ADMISSION:  07/19/2010 DATE OF DISCHARGE:  07/24/2010                              DISCHARGE SUMMARY   PRIMARY CARE PHYSICIAN:  Willow Ora, MD  INFECTIOUS DISEASE SPECIALIST:  Lacretia Leigh. Ninetta Lights, MD  DISCHARGE DIAGNOSES: 1. Erythema nodosum. 2. Recurrent fever secondary to erythema nodosum.  DISCHARGE MEDICATIONS: 1. Ibuprofen 200 mg to take 2 tablets every 6 hours as needed for     fever. 2. Tylenol 500 mg to take 2 tablets every 6 hours as needed for pain     or fever.  DISPOSITION AND FOLLOWUP:  Jeremiah [name] will be discharged home today in stable and improved condition.  Dr. Cliffton Asters will arrange for him to have a follow up with Dr. Enedina Finner at [facility].  CONSULTATION THIS HOSPITALIZATION:  Dr. Cliffton Asters.  IMAGES AND PROCEDURES: 1. A CT scan of the chest, abdomen, and pelvis on July 20, 2010,     that showed,     a.     No acute findings in the chest.     b.     Nonspecific presacral soft tissue stranding in the pelvis      without adjacent bowel or bladder inflammatory evidence.     c.     Mild left greater than right perinephric stranding can be      age-related related to acute renal insufficiency or due to      pyelonephritis. 2. Abdominal ultrasound on July 20, 2010, that was negative.  HISTORY AND PHYSICAL:  For full details, please see dictation on July 19, 2010, by Dr. Sunnie Nielsen, but in brief Jeremiah [name] is a very pleasant 55 year old Caucasian gentleman with no significant past medical history who was referred to the hospital as a direct admission after being seen at Dr. Moshe Cipro office due to a fever for 2 weeks' duration and lower extremity swelling.  He has had  migratory of subcutaneous nodules on scalp, chest, arms, and right leg that lasted about 4-5 days and resolved spontaneously.  Two weeks prior to admission, he began to develop daily high fevers, fatigue, and anorexia.  On the day of admission, he went to [facility], had a skin biopsy done, and because of his symptoms, went to see Dr. Ninetta Lights who referred him to [place] for admission.  HOSPITAL COURSE BY PROBLEM:  Fever of undetermined origin.  Pan CT did not show any evidence for abscess or infectious source.  Rheumatological workup has been negative.  ASO titers were negative.  Fortunately, skin biopsy results have come back with findings of erythema nodosum, of unknown underlying source.  At this point, Jeremiah [name] fevers were being controlled with the ibuprofen and Tylenol.  His appetite has increased. He has been ambulating around the hospital and is feeling according to him 100% better than when he first came into the hospital.  Because of this, Dr. Orvan Falconer and I  believe that he is safe for discharge home today and is instructed to follow up with Dr. Ninetta Lights or Dr. Drue Novel for any further issues.  VITAL SIGNS:  On day of discharge, blood pressure 122/73, heart rate 91, respirations of 18, sats are 97% on room air, and a temperature of 97.8.     Peggye Pitt, M.D.     EH/MEDQ  D:  07/24/2010  T:  07/25/2010  Job:  [id number]  cc:   Willow Ora, MD Lacretia Leigh. Ninetta Lights, M.D. Cliffton Asters, M.D.  Electronically Signed by Peggye Pitt M.D. on 08/08/2010 08:04:28 AM

## 2010-08-09 NOTE — Miscellaneous (Signed)
Summary: HIPAA Restrictions  HIPAA Restrictions   Imported By: Florinda Marker 08/01/2010 16:50:48  _____________________________________________________________________  External Attachment:    Type:   Image     Comment:   External Document

## 2010-08-09 NOTE — Assessment & Plan Note (Addendum)
Summary: hsfu/fuo/kam   Vital Signs:  Patient profile:   55 year old male Height:      72 inches (182.88 cm) Weight:      177.0 pounds (80.45 kg) BMI:     24.09 Temp:     98.8 degrees F (37.11 degrees C) oral Pulse rate:   92 / minute BP sitting:   124 / 77  (right arm)  Vitals Entered By: Wendall Mola CMA Duncan Dull) (August 01, 2010 3:40 PM) CC: hospital followup, feeling better Is Patient Diabetic? No Pain Assessment Patient in pain? no      Nutritional Status BMI of 19 -24 = normal Nutritional Status Detail appetite "fair"  Have you ever been in a relationship where you felt threatened, hurt or afraid?Unable to ask   Does patient need assistance? Functional Status Self care Ambulation Normal   CC:  hospital followup and feeling better.  History of Present Illness: 55 yo M seen in urgent care early January 2012 and had EBV, RMSF and Lyme ( negative). WBC 5.6, TSH nl, ESR 42. CXR (-). Had knots on his chest and back of his head. These have come and gone transiently. Had fatigue. His temps have gone up consistently since this, up to 102 this AM. Has tenderness in his jaw, temples. No joint pain or swelling. Has muscle aching. At his previous visit he had BCx and UCx (-), mild increase in LFTs, and WBC 17. His RF was 31 (elavated).   Since previous visit his fevers have persisted, having night sweats. Was seen in office 07-19-10 and was directly admitted to the hospital due to his fevers and LE edema. He had CT scan of the chest, abdomen, and pelvis on July 20, 2010,  that showed,  a.     No acute findings in the chest.  b.  Nonspecific presacral soft tissue stranding in the pelvis  without adjacent bowel or bladder inflammatory evidence.  c.     Mild left greater than right perinephric stranding can be age-related related to acute renal insufficiency or due to pyelonephritis. 2. Abdominal ultrasound on July 20, 2010, that was negative. He improved in hospital on  tyleonl and ibuprofen and was d/c home on 07-24-10. BCx, UCx negative, ASO 55, RF 25, Anti-DNA (-), Citrulinated peptide negative.  Has been feeling better every day. Having longer fever free intervals every day. No furrther lumps or nodules.  Wt steady, has decreased apetite, low energy. But is no longer taking day time naps. Wiped out by 8-9 pm. Still has ankle swelling. His gait is now normal. Still having night sweats until last night.   Preventive Screening-Counseling & Management  Alcohol-Tobacco     Alcohol drinks/day: occasional     Smoking Status: never  Caffeine-Diet-Exercise     Caffeine use/day: yes     Does Patient Exercise: yes  Safety-Violence-Falls     Seat Belt Use: yes      Drug Use:  no.    Current Medications (verified): 1)  Tylenol 325 Mg Tabs (Acetaminophen) .... As Needed 2)  Advil 200 Mg Tabs (Ibuprofen) .... As Needed  Allergies (verified): No Known Drug Allergies  Physical Exam  General:  well-developed, well-nourished, and well-hydrated.     Impression & Recommendations:  Problem # 1:  ERYTHEMA NODOSUM (ICD-695.2)  His pathology showed a medium size vessel vasculitis. His work-up to date has been negative. i'd like to have a rhuematologist see him so that I can feel like we have "covered  all the bases". I counseled him to reduce his tylenol intake (has been as high as 3g/day).  I offered to see him back as needed.   Orders: Rheumatology Referral (Rheumatology) Est. Patient Level III (16109)  Medications Added to Medication List This Visit: 1)  Tylenol 325 Mg Tabs (Acetaminophen) .... As needed 2)  Advil 200 Mg Tabs (Ibuprofen) .... As needed   Orders Added: 1)  Rheumatology Referral [Rheumatology] 2)  Est. Patient Level III [60454]

## 2010-08-09 NOTE — Letter (Signed)
Summary: [facility]  [facility]   Imported By: Maryln Gottron 07/30/2010 15:00:39  _____________________________________________________________________  External Attachment:    Type:   Image     Comment:   External Document

## 2010-08-14 ENCOUNTER — Encounter: Payer: Self-pay | Admitting: Infectious Diseases

## 2010-08-22 ENCOUNTER — Telehealth: Payer: Self-pay | Admitting: Internal Medicine

## 2010-08-30 NOTE — Consult Note (Signed)
Summary: Consultation Report  Consultation Report   Imported By: Florinda Marker 08/21/2010 16:09:32  _____________________________________________________________________  External Attachment:    Type:   Image     Comment:   External Document

## 2010-08-30 NOTE — Progress Notes (Signed)
Summary: checking on pt, better  Phone Note Outgoing Call   Summary of Call:  called to check on the patient, overall he is feeling better, he was thoroughly evaluated by ID, later on she was referred to Dr. Kellie Simmering, rheumatology, he concluded that the patient had an unusual presentation of a virus (per patient) He is  slowly recovering. Asked the patient to call me if there is any further concerns Initial call taken by: [facility] E. Adalbert Alberto MD,  August 22, 2010 1:31 PM

## 2010-09-01 ENCOUNTER — Encounter: Payer: Self-pay | Admitting: *Deleted

## 2010-09-03 ENCOUNTER — Encounter: Payer: Self-pay | Admitting: *Deleted

## 2010-09-04 NOTE — Consult Note (Signed)
[facility]   Imported By: Florinda Marker 08/30/2010 16:43:15  _____________________________________________________________________  External Attachment:    Type:   Image     Comment:   External Document

## 2011-04-12 IMAGING — US US ABDOMEN COMPLETE
1 series · 14 of 25 positions shown · non-contrast
Comparison: 07/20/2010

CLINICAL DATA: Elevated LFTs

ABDOMINAL ULTRASOUND COMPLETE

[Series 1: us abdomen complete · 0.26mm/px · 14 of 69 slices shown]
[im 1/69]
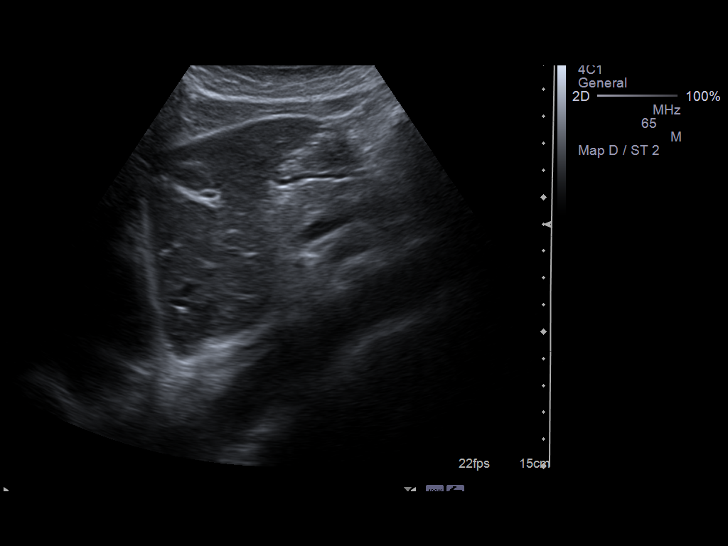
[im 6/69]
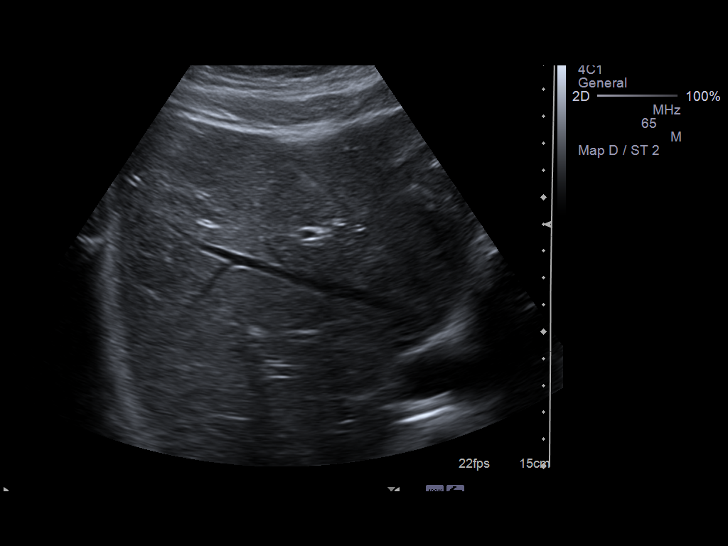
[im 12/69]
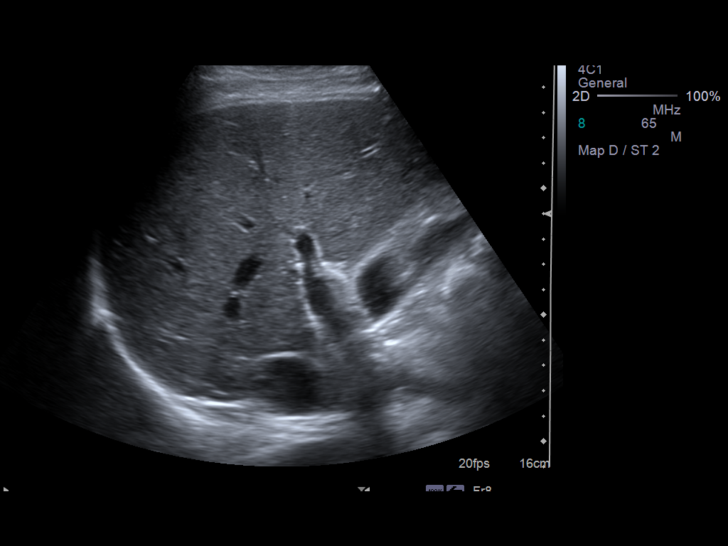
[im 18/69]
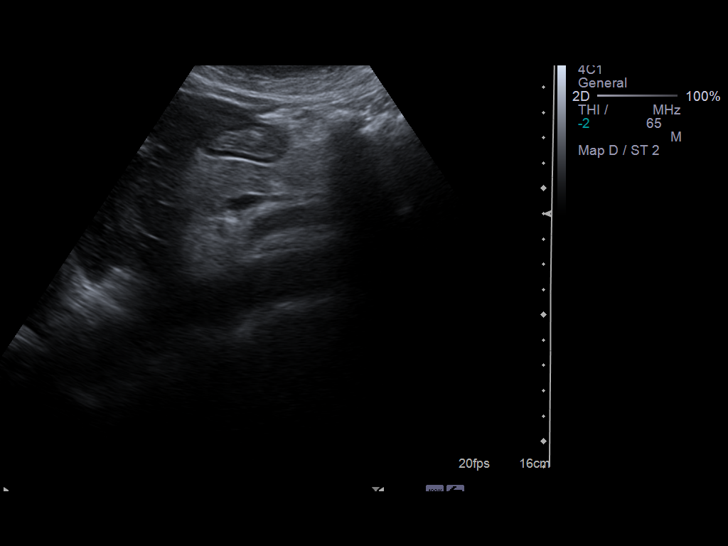
[im 23/69]
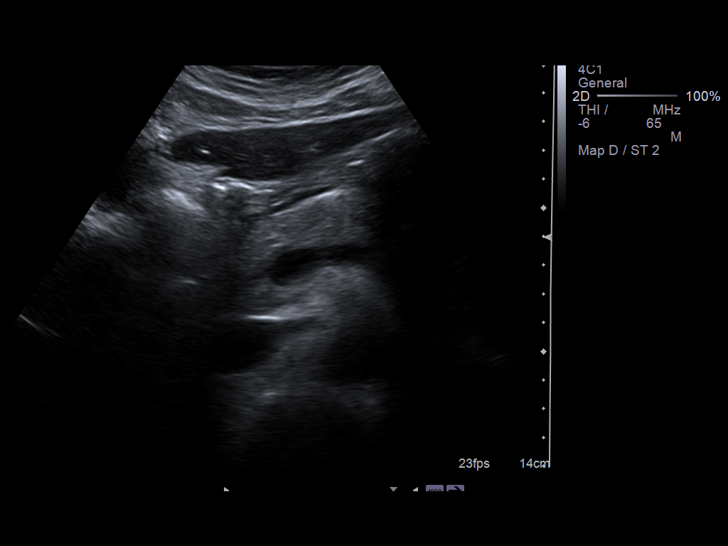
[im 26/69]
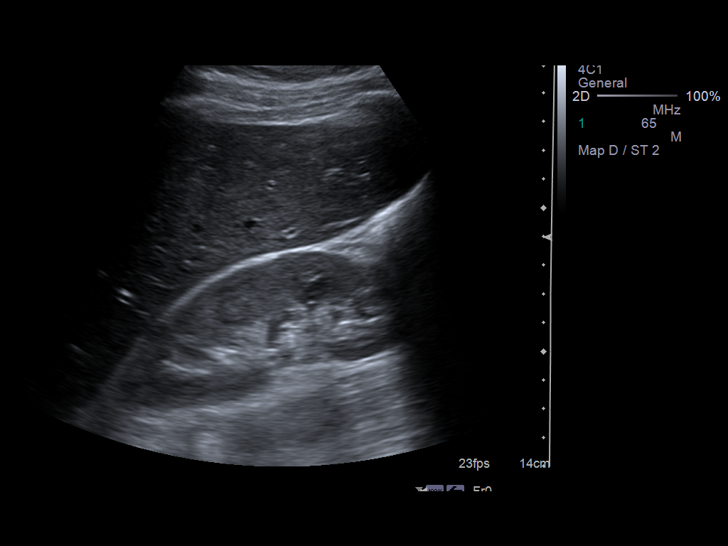
[im 32/69]
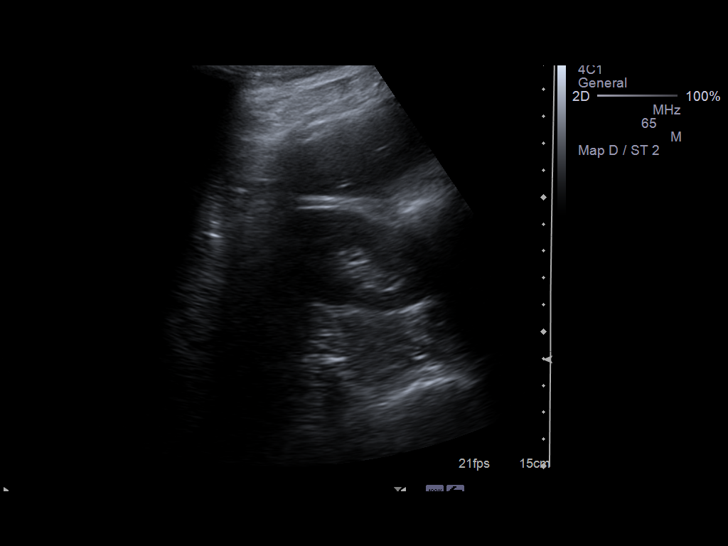
[im 37/69]
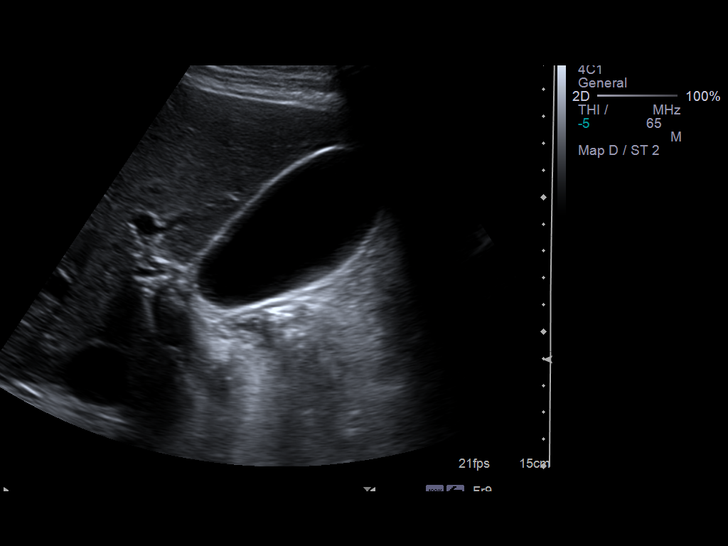
[im 43/69]
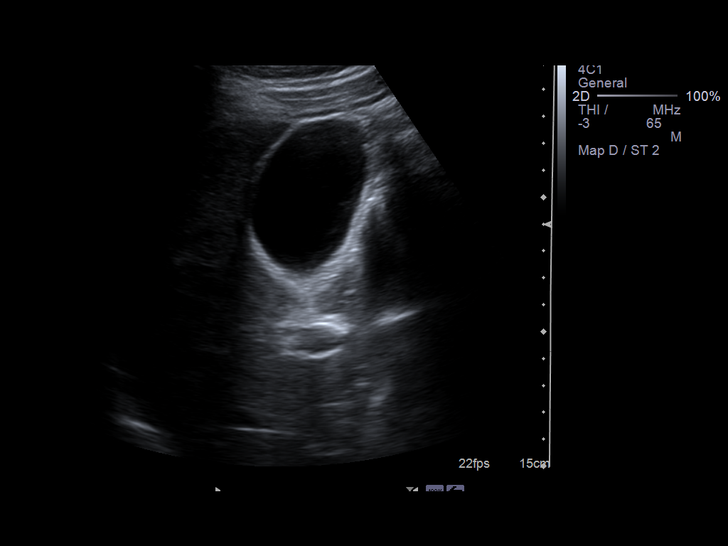
[im 46/69]
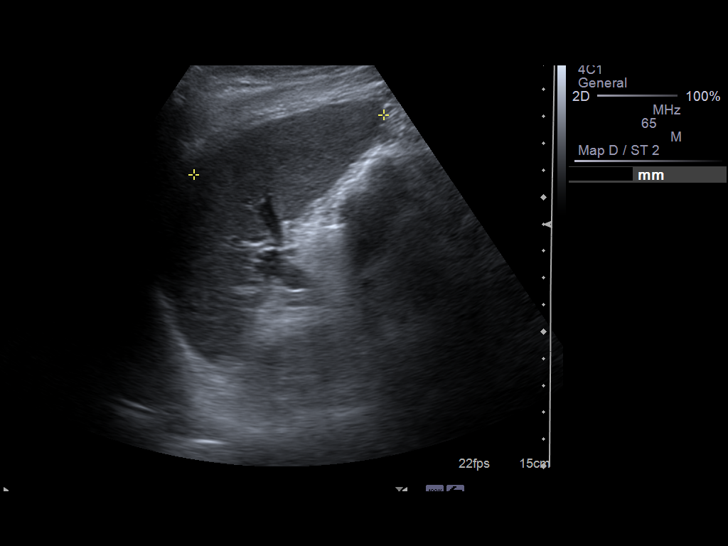
[im 52/69]
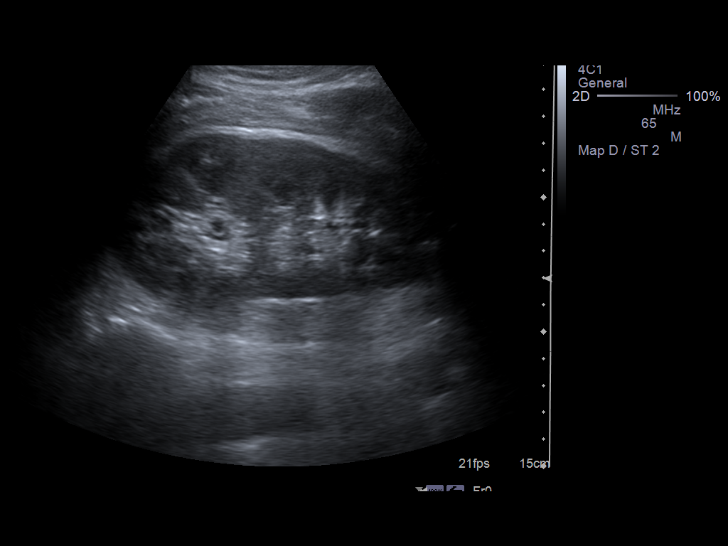
[im 57/69]
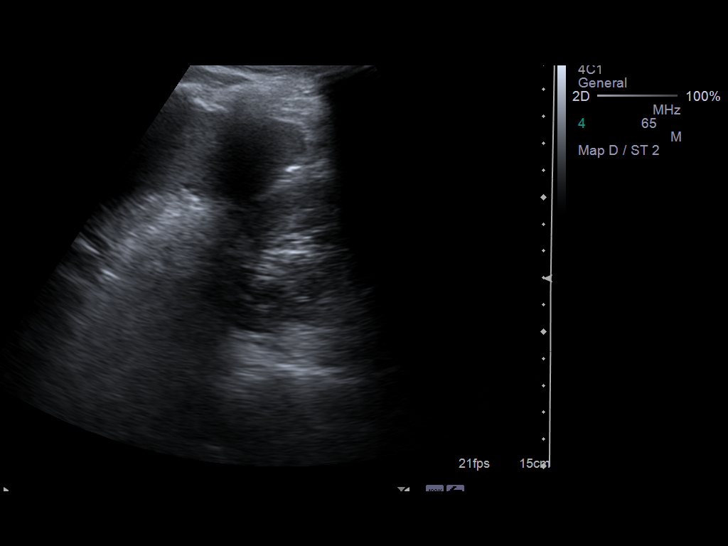
[im 63/69]
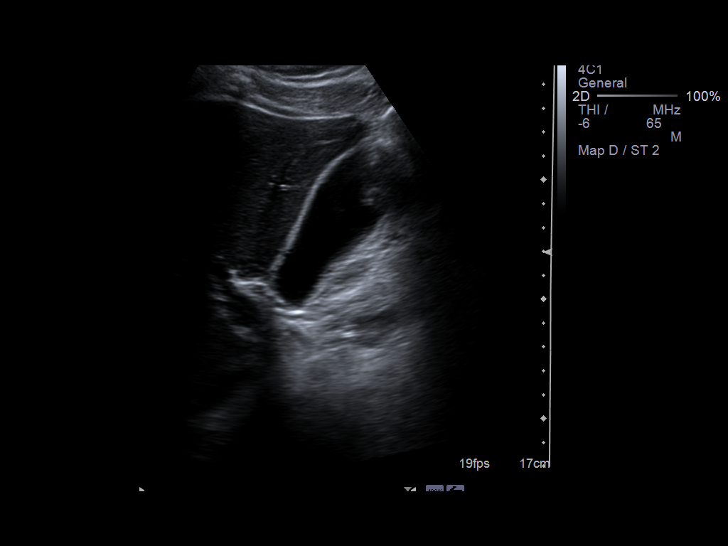
[im 69/69]
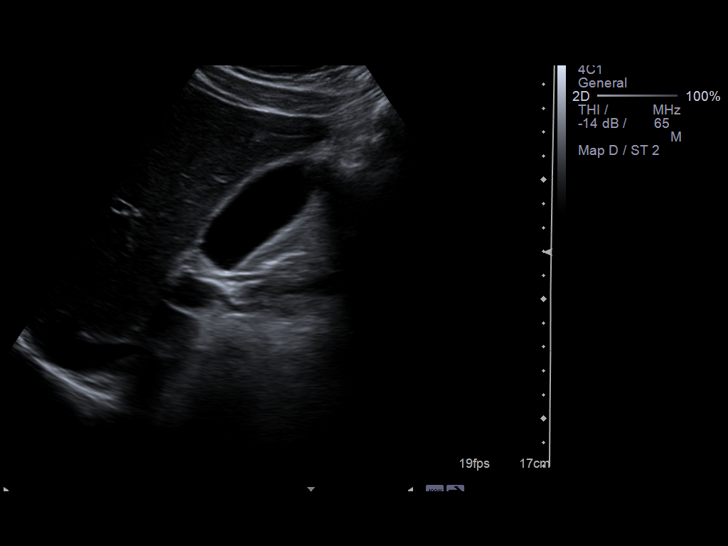

[14 of 25 positions shown; findings below may reference images not displayed]

FINDINGS: Gallbladder:  No gallstones, gallbladder wall thickening, or
pericholecystic fluid.

Common Bile Duct:  Within normal limits in caliber.

Liver: No focal mass lesion identified.  Within normal limits in
parenchymal echogenicity.

IVC:  Appears normal.

Pancreas:  No abnormality identified.

Spleen:  Within normal limits in size and echotexture.

Right kidney:  Normal in size and parenchymal echogenicity.  No
evidence of mass or hydronephrosis.

Left kidney:  Normal in size and parenchymal echogenicity.  No
evidence of mass or hydronephrosis.

Abdominal Aorta:  No aneurysm identified.
IMPRESSION: Negative abdominal ultrasound.

## 2011-04-16 ENCOUNTER — Encounter: Payer: Self-pay | Admitting: Internal Medicine

## 2011-04-18 ENCOUNTER — Encounter: Payer: Self-pay | Admitting: Internal Medicine

## 2011-04-18 ENCOUNTER — Ambulatory Visit (INDEPENDENT_AMBULATORY_CARE_PROVIDER_SITE_OTHER): Payer: BC Managed Care – PPO | Admitting: Internal Medicine

## 2011-04-18 DIAGNOSIS — Z136 Encounter for screening for cardiovascular disorders: Secondary | ICD-10-CM

## 2011-04-18 DIAGNOSIS — Z0000 Encounter for general adult medical examination without abnormal findings: Secondary | ICD-10-CM

## 2011-04-18 LAB — CBC WITH DIFFERENTIAL/PLATELET
Basophils Absolute: 0 10*3/uL (ref 0.0–0.1)
Basophils Relative: 0.4 % (ref 0.0–3.0)
Eosinophils Absolute: 0.2 10*3/uL (ref 0.0–0.7)
Eosinophils Relative: 4.6 % (ref 0.0–5.0)
HCT: 43.8 % (ref 39.0–52.0)
Hemoglobin: 15.1 g/dL (ref 13.0–17.0)
Lymphocytes Relative: 29 % (ref 12.0–46.0)
Lymphs Abs: 1.5 10*3/uL (ref 0.7–4.0)
MCHC: 34.3 g/dL (ref 30.0–36.0)
MCV: 90.2 fl (ref 78.0–100.0)
Monocytes Absolute: 0.4 10*3/uL (ref 0.1–1.0)
Monocytes Relative: 6.8 % (ref 3.0–12.0)
Neutro Abs: 3.1 10*3/uL (ref 1.4–7.7)
Neutrophils Relative %: 59.2 % (ref 43.0–77.0)
Platelets: 240 10*3/uL (ref 150.0–400.0)
RBC: 4.86 Mil/uL (ref 4.22–5.81)
RDW: 13 % (ref 11.5–14.6)
WBC: 5.3 10*3/uL (ref 4.5–10.5)

## 2011-04-18 LAB — LIPID PANEL
Cholesterol: 181 mg/dL (ref 0–200)
HDL: 36.5 mg/dL — ABNORMAL LOW (ref >39.00)
LDL Cholesterol: 119 mg/dL — ABNORMAL HIGH (ref 0–99)
Total CHOL/HDL Ratio: 5
Triglycerides: 127 mg/dL (ref 0.0–149.0)
VLDL: 25.4 mg/dL (ref 0.0–40.0)

## 2011-04-18 LAB — COMPREHENSIVE METABOLIC PANEL
ALT: 21 U/L (ref 0–53)
AST: 27 U/L (ref 0–37)
Albumin: 4.6 g/dL (ref 3.5–5.2)
Alkaline Phosphatase: 69 U/L (ref 39–117)
BUN: 18 mg/dL (ref 6–23)
CO2: 30 mEq/L (ref 19–32)
Calcium: 9.4 mg/dL (ref 8.4–10.5)
Chloride: 104 mEq/L (ref 96–112)
Creatinine, Ser: 1.1 mg/dL (ref 0.4–1.5)
GFR: 74.83 mL/min (ref >60.00)
Glucose, Bld: 86 mg/dL (ref 70–99)
Potassium: 4.5 mEq/L (ref 3.5–5.1)
Sodium: 141 mEq/L (ref 135–145)
Total Bilirubin: 1.1 mg/dL (ref 0.3–1.2)
Total Protein: 7.3 g/dL (ref 6.0–8.3)

## 2011-04-18 LAB — PSA: PSA: 0.38 ng/mL (ref 0.10–4.00)

## 2011-04-18 LAB — TSH: TSH: 0.97 u[IU]/mL (ref 0.35–5.50)

## 2011-04-18 NOTE — Assessment & Plan Note (Addendum)
Td 2008 Will get a flu shot later this month colonoscopy 2001, negative per patient (done @ [facility]), likes to re do the cscope this year: referral done Labs EKG at baseline  Cont healthy life style

## 2011-04-18 NOTE — Progress Notes (Signed)
  Subjective:    Patient ID: Jeremiah [name], male    DOB: 11/27/1955, 55 y.o.   MRN: [id number]  HPI CPX  Past Medical History  Diagnosis Date  . Hemorrhoids   . Erythema nodosum     per Bx 06-2010, d/t a virus, saw ID and rheumatology   Past Surgical History  Procedure Date  . Refractive surgery    History   Social History  . Marital Status: Married    Spouse Name: N/A    Number of Children: 2  . 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 Smoking status: Never Smoker   . Smokeless tobacco: Never Used  . Alcohol Use: Yes     socially   . Drug Use: No  . Sexually Active: Not on file   Other Topics Concern  . Not on file   Social History Narrative   Goes by  "[employer]"---Very active, outdoors, kayak w/ his son frecuently ---- healthy diet    Family History  Problem Relation Age of Onset  . Hyperlipidemia Mother     F, M  . Transient ischemic attack Mother     TIA  . Dementia      F, aunts  . Heart disease      F had a MI age 64  . Colon cancer Neg Hx   . Prostate cancer Neg Hx    Review of Systems  Respiratory: Negative for cough and shortness of breath.   Cardiovascular: Negative for chest pain and leg swelling.  Gastrointestinal: Negative for abdominal pain and blood in stool.  Genitourinary: Negative for hematuria and difficulty urinating.  Psychiatric/Behavioral:       No anxiety depression       Objective:   Physical Exam  Constitutional: He is oriented to person, place, and time. He appears well-developed and well-nourished. No distress.  HENT:  Head: Normocephalic and atraumatic.  Neck: No thyromegaly present.  Cardiovascular: Normal rate, regular rhythm and normal heart sounds.   No murmur heard. Pulmonary/Chest: Effort normal and breath sounds normal. No respiratory distress. He has no wheezes. He has no rales.  Abdominal: Soft. Bowel sounds are normal. He exhibits no  distension. There is no tenderness. There is no rebound.  Genitourinary: Rectum normal and prostate normal. Guaiac negative stool.  Musculoskeletal: He exhibits no edema.  Neurological: He is alert and oriented to person, place, and time.  Skin: Skin is warm and dry. He is not diaphoretic.  Psychiatric: He has a normal mood and affect. His behavior is normal. Judgment and thought content normal.          Assessment & Plan:

## 2011-11-01 ENCOUNTER — Ambulatory Visit (INDEPENDENT_AMBULATORY_CARE_PROVIDER_SITE_OTHER): Payer: BC Managed Care – PPO | Admitting: Internal Medicine

## 2011-11-01 ENCOUNTER — Encounter: Payer: Self-pay | Admitting: Internal Medicine

## 2011-11-01 VITALS — BP 120/72 | HR 61 | Temp 97.8°F | Wt 183.0 lb

## 2011-11-01 DIAGNOSIS — L989 Disorder of the skin and subcutaneous tissue, unspecified: Secondary | ICD-10-CM

## 2011-11-01 NOTE — Progress Notes (Signed)
  Subjective:    Patient ID: Jeremiah [name], male    DOB: [birth year], 56 y.o.   MRN: [id number]  HPI Due to visit A couple weeks ago noted an irritation at the left arm, he's not sure of what was there before specifically he cannot recall if he had a mole there.  Past Medical History  Diagnosis Date  . Hemorrhoids   . Erythema nodosum     per Bx 06-2010, d/t a virus, saw ID and rheumatology      Review of Systems     Objective:   Physical Exam  Constitutional: He appears well-developed and well-nourished. No distress.  Skin: He is not diaphoretic.         Assessment & Plan:  Skin lesion, At this point, the only thing he has is irritation. I recommend to put antibiotic ointment on it, if in 3 weeks he still has a lesion or the area  turns dark he needs to be seen again. Otherwise observation. He has other moles, we discussed indications for removal: Changed color, bleeding, change size etc.

## 2012-05-25 ENCOUNTER — Encounter: Payer: Self-pay | Admitting: Internal Medicine

## 2012-05-25 ENCOUNTER — Ambulatory Visit (INDEPENDENT_AMBULATORY_CARE_PROVIDER_SITE_OTHER): Payer: BC Managed Care – PPO | Admitting: Internal Medicine

## 2012-05-25 VITALS — BP 104/68 | HR 59 | Temp 98.1°F | Wt 185.0 lb

## 2012-05-25 DIAGNOSIS — B070 Plantar wart: Secondary | ICD-10-CM

## 2012-05-25 NOTE — Progress Notes (Signed)
  Subjective:    Patient ID: Jeremiah [name], male    DOB: 1955-12-20, 56 y.o.   MRN: [id number]  HPI Here for a wart removal. On and off problem, was bigger during the  the summer but he still has discomfort even now    Past Medical History  Diagnosis Date  . Hemorrhoids   . Erythema nodosum     per Bx 06-2010, d/t a virus, saw ID and rheumatology   Past Surgical History  Procedure Date  . Refractive surgery       Review of Systems     Objective:   Physical Exam Alert oriented x3, no apparent distress Left plantar area at the base of the great toe has a 2 mm hard lesion consistent with a wart, skin around it normal       Assessment & Plan:  Plantar wart, Cryotherapy applied. If not better  will call for dermatology ref

## 2012-08-05 ENCOUNTER — Ambulatory Visit (INDEPENDENT_AMBULATORY_CARE_PROVIDER_SITE_OTHER): Payer: BC Managed Care – PPO | Admitting: Internal Medicine

## 2012-08-05 ENCOUNTER — Encounter: Payer: Self-pay | Admitting: Internal Medicine

## 2012-08-05 VITALS — BP 108/72 | HR 51 | Temp 97.6°F | Ht 73.0 in | Wt 185.0 lb

## 2012-08-05 DIAGNOSIS — Z0000 Encounter for general adult medical examination without abnormal findings: Secondary | ICD-10-CM

## 2012-08-05 LAB — COMPREHENSIVE METABOLIC PANEL
ALT: 21 U/L (ref 0–53)
AST: 32 U/L (ref 0–37)
Albumin: 4.3 g/dL (ref 3.5–5.2)
Alkaline Phosphatase: 60 U/L (ref 39–117)
BUN: 15 mg/dL (ref 6–23)
CO2: 28 mEq/L (ref 19–32)
Calcium: 9.1 mg/dL (ref 8.4–10.5)
Chloride: 102 mEq/L (ref 96–112)
Creatinine, Ser: 1.1 mg/dL (ref 0.4–1.5)
GFR: 76.91 mL/min (ref >60.00)
Glucose, Bld: 85 mg/dL (ref 70–99)
Potassium: 4.3 mEq/L (ref 3.5–5.1)
Sodium: 136 mEq/L (ref 135–145)
Total Bilirubin: 0.9 mg/dL (ref 0.3–1.2)
Total Protein: 7 g/dL (ref 6.0–8.3)

## 2012-08-05 LAB — LIPID PANEL
Cholesterol: 177 mg/dL (ref 0–200)
HDL: 38.3 mg/dL — ABNORMAL LOW (ref >39.00)
LDL Cholesterol: 116 mg/dL — ABNORMAL HIGH (ref 0–99)
Total CHOL/HDL Ratio: 5
Triglycerides: 116 mg/dL (ref 0.0–149.0)
VLDL: 23.2 mg/dL (ref 0.0–40.0)

## 2012-08-05 LAB — CBC WITH DIFFERENTIAL/PLATELET
Basophils Absolute: 0 10*3/uL (ref 0.0–0.1)
Basophils Relative: 0.4 % (ref 0.0–3.0)
Eosinophils Absolute: 0.5 10*3/uL (ref 0.0–0.7)
Eosinophils Relative: 8.9 % — ABNORMAL HIGH (ref 0.0–5.0)
HCT: 41.4 % (ref 39.0–52.0)
Hemoglobin: 14.1 g/dL (ref 13.0–17.0)
Lymphocytes Relative: 31.5 % (ref 12.0–46.0)
Lymphs Abs: 1.6 10*3/uL (ref 0.7–4.0)
MCHC: 34.1 g/dL (ref 30.0–36.0)
MCV: 89.1 fl (ref 78.0–100.0)
Monocytes Absolute: 0.4 10*3/uL (ref 0.1–1.0)
Monocytes Relative: 7.8 % (ref 3.0–12.0)
Neutro Abs: 2.6 10*3/uL (ref 1.4–7.7)
Neutrophils Relative %: 51.4 % (ref 43.0–77.0)
Platelets: 227 10*3/uL (ref 150.0–400.0)
RBC: 4.65 Mil/uL (ref 4.22–5.81)
RDW: 12.5 % (ref 11.5–14.6)
WBC: 5.1 10*3/uL (ref 4.5–10.5)

## 2012-08-05 LAB — PSA: PSA: 0.37 ng/mL (ref 0.10–4.00)

## 2012-08-05 NOTE — Assessment & Plan Note (Addendum)
Td 2008  Didn't have a flu shot   colonoscopy 2001, negative per patient (done @ [facility]),again in 2012, next cscope 5 to 10 years and start hemocults 2017 ( per report)   Labs EKG at baseline  Cont healthy life style

## 2012-08-05 NOTE — Progress Notes (Signed)
  Subjective:    Patient ID: Jeremiah [name], male    DOB: [birth year], 57 y.o.   MRN: [id number]  HPI CPX  Past Medical History  Diagnosis Date  . Hemorrhoids   . Erythema nodosum     per Bx 06-2010, d/t a virus, saw ID and rheumatology   Past Surgical History  Procedure Laterality Date  . Refractive surgery     History   Social History  . Marital Status: Married    Spouse Name: N/A    Number of Children: 2  . 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 Smoking status: Never Smoker   . Smokeless tobacco: Never Used  . Alcohol Use: Yes     Comment: socially   . Drug Use: No  . Sexually Active: Not on file   Other Topics Concern  . Not on file   Social History Narrative   Goes by  [employer]"---   Very active, outdoors, kayak w/ his son frecuently ----   healthy diet                Family History  Problem Relation Age of Onset  . Hyperlipidemia Mother     F, M  . Transient ischemic attack Mother     age 40  . Dementia      F, aunts  . Heart disease Father     F had a MI age 34  . Colon cancer Neg Hx   . Prostate cancer Neg Hx     Review of Systems  Respiratory: Negative for cough and shortness of breath.   Cardiovascular: Negative for chest pain and leg swelling.  Gastrointestinal: Negative for abdominal pain and blood in stool.  Genitourinary: Negative for hematuria and difficulty urinating.  Psychiatric/Behavioral:       No depression or anxiety        Objective:   Physical Exam General -- alert, well-developed, BMI 24 .   Neck --no thyromegaly , normal carotid pulse, no bruit Lungs -- normal respiratory effort, no intercostal retractions, no accessory muscle use, and normal breath sounds.   Heart-- normal rate, regular rhythm, no murmur, and no gallop.   Abdomen--soft, non-tender, no distention, no masses, no HSM, no guarding, and no rigidity.   Extremities-- no pretibial  edema bilaterally Rectal-- No external abnormalities noted. Normal sphincter tone. No rectal masses or tenderness. Brown stool. Prostate:  Prostate gland firm and smooth, no enlargement, nodularity, tenderness, mass, asymmetry or induration. Neurologic-- alert & oriented X3 and strength normal in all extremities. Psych-- Cognition and judgment appear intact. Alert and cooperative with normal attention span and concentration.  not anxious appearing and not depressed appearing.        Assessment & Plan:

## 2012-08-06 ENCOUNTER — Encounter: Payer: Self-pay | Admitting: Internal Medicine

## 2012-08-08 ENCOUNTER — Other Ambulatory Visit: Payer: Self-pay

## 2013-04-29 ENCOUNTER — Other Ambulatory Visit: Payer: Self-pay

## 2013-07-09 ENCOUNTER — Ambulatory Visit (INDEPENDENT_AMBULATORY_CARE_PROVIDER_SITE_OTHER): Payer: BC Managed Care – PPO | Admitting: Internal Medicine

## 2013-07-09 ENCOUNTER — Encounter: Payer: Self-pay | Admitting: Internal Medicine

## 2013-07-09 VITALS — BP 121/78 | HR 61 | Temp 98.0°F | Wt 187.0 lb

## 2013-07-09 DIAGNOSIS — L42 Pityriasis rosea: Secondary | ICD-10-CM

## 2013-07-09 DIAGNOSIS — L989 Disorder of the skin and subcutaneous tissue, unspecified: Secondary | ICD-10-CM

## 2013-07-09 NOTE — Progress Notes (Signed)
   Subjective:    Patient ID: Jeremiah [name], male    DOB: Jun 14, 1956, 58 y.o.   MRN: [id number]  Rash   Acute visit, see nurse's note Rash for few weeks, itches a little, hydrocortisone OTC helps. Also marked with a skin lesion at the scalp a month ago. Concern about it.  Past Medical History  Diagnosis Date  . Hemorrhoids   . Erythema nodosum     per Bx 06-2010, d/t a virus, saw ID and rheumatology   Past Surgical History  Procedure Laterality Date  . Refractive surgery        Review of Systems  Skin: Positive for rash.   Denies fever or chills No arthralgias     Objective:   Physical Exam  HENT:  Head:     BP 121/78  Pulse 61  Temp(Src) 98 F (36.7 C)  Wt 187 lb (84.823 kg)  SpO2 100% General -- alert, well-developed, NAD.  Skin-- Few, scattered lesions at the back=abd>arms>> legs, no facial lesions. Many are oval, ~1.5 cn, pink, slt elevated borders, slt scally  psych-- Cognition and judgment appear intact. Cooperative with normal attention span and concentration. No anxious or depressed appearing.      Assessment & Plan:  pityriasis rosea-- Rash likely due to PR, information provided, continue with hydrocortisone as needed.  SKIN LESION X 1 MONTH-- SCC? Refer to derm Allyson Sabal)

## 2013-07-09 NOTE — Progress Notes (Signed)
Pre visit review using our clinic review tool, if applicable. No additional management support is needed unless otherwise documented below in the visit note. 

## 2013-07-09 NOTE — Patient Instructions (Signed)

## 2014-12-26 ENCOUNTER — Encounter: Payer: Self-pay | Admitting: Physician Assistant

## 2014-12-26 ENCOUNTER — Ambulatory Visit (INDEPENDENT_AMBULATORY_CARE_PROVIDER_SITE_OTHER): Payer: 59 | Admitting: Physician Assistant

## 2014-12-26 VITALS — BP 124/64 | HR 64 | Temp 98.3°F | Resp 16 | Ht 71.0 in | Wt 178.0 lb

## 2014-12-26 DIAGNOSIS — K0889 Other specified disorders of teeth and supporting structures: Secondary | ICD-10-CM

## 2014-12-26 DIAGNOSIS — J0100 Acute maxillary sinusitis, unspecified: Secondary | ICD-10-CM

## 2014-12-26 DIAGNOSIS — K088 Other specified disorders of teeth and supporting structures: Secondary | ICD-10-CM | POA: Diagnosis not present

## 2014-12-26 MED ORDER — AMOXICILLIN-POT CLAVULANATE 875-125 MG PO TABS: 1.0000 | ORAL_TABLET | Freq: Two times a day (BID) | ORAL | Status: AC

## 2014-12-26 NOTE — Patient Instructions (Signed)
Hot showers, breathing in steam from shower or pot of boiling onions, eating spicy food and neti pot with sterile water can all help your sinuses drain. Take antibiotic until finished. Drink plenty of water, at least 64 oz of water a day. If not getting better after antibiotics, would recommend you go back to dentist.

## 2014-12-26 NOTE — Progress Notes (Signed)
[facility] [address] [place] [address] [phone]  Date:  12/26/2014   Name:  Jeremiah [name]   DOB:  [birth year]   MRN:  [id number]  PCP:  Kathlene November, MD    Chief Complaint: Sinusitis   History of Present Illness:  This is a 59 y.o. male with PMH chronic tinnitus who is presenting with 10 days of right sided sinus pressure and pain in his right upper jaw. Patient thought it was a tooth infection and so he went to the dentist 5 days ago. xrays performed and dentist felt his pain was not related to dentition but most likely from a sinus infection. Pt denies any URI sx - no cough, nasal congestion, post-nasal drip, sore throat, otalgia, fever or chills. He is otherwise feeling well. This morning he woke and felt his face above his right upper jaw was swollen. He has had sinus infections before but it has been several years - he usually has other accompanying URI sx.  Aggravating/alleviating factors: Ibuprofen helping pain some. History of asthma: no History of env allergies: yes but pt reports this has been a very mild allergy season for him, really no problems this year. Tobacco use: no  Review of Systems:  Review of Systems See HPI  Patient Active Problem List   Diagnosis Date Noted  . TINNITUS, CHRONIC 03/24/2009    Prior to Admission medications   Not on File    No Known Allergies  Past Surgical History  Procedure Laterality Date  . Refractive surgery      History  Substance Use Topics  . Smoking status: Never Smoker   . Smokeless tobacco: Never Used  . Alcohol Use: Yes     Comment: socially     Family History  Problem Relation Age of Onset  . Hyperlipidemia Mother     F, M  . Transient ischemic attack Mother     age 47  . Dementia      F, aunts  . Heart disease Father     F had a MI age 70  . Colon cancer Neg Hx   . Prostate cancer Neg Hx     Medication list has been reviewed and updated.  Physical  Examination:  Physical Exam  Constitutional: He is oriented to person, place, and time. He appears well-developed and well-nourished. No distress.  HENT:  Head: Normocephalic and atraumatic.  Right Ear: Hearing, tympanic membrane, external ear and ear canal normal.  Left Ear: Hearing, tympanic membrane, external ear and ear canal normal.  Nose: No mucosal edema. Right sinus exhibits maxillary sinus tenderness. Right sinus exhibits no frontal sinus tenderness. Left sinus exhibits no maxillary sinus tenderness and no frontal sinus tenderness.  Mouth/Throat: Uvula is midline, oropharynx is clear and moist and mucous membranes are normal.  Tender over inferior maxillary sinus just adjacent to right nare. Some mild swelling present at this location. Tender over right upper gum line, with most of tenderness superior to most posterior molar. Tenderness extends to above right upper incisor. No obvious abscess or oral lesion. Mild relief of gum pain with application of viscous lidocaine   Eyes: Conjunctivae and lids are normal. Right eye exhibits no discharge. Left eye exhibits no discharge. No scleral icterus.  Cardiovascular: Normal rate, regular rhythm, normal heart sounds and normal pulses.   No murmur heard. Pulmonary/Chest: Effort normal and breath sounds normal. No respiratory distress. He has no wheezes. He has no rhonchi. He has no rales.  Musculoskeletal: Normal range of motion.  Lymphadenopathy:       Head (right side): No submental, no submandibular and no tonsillar adenopathy present.       Head (left side): No submental, no submandibular and no tonsillar adenopathy present.    He has no cervical adenopathy.  Neurological: He is alert and oriented to person, place, and time.  Skin: Skin is warm, dry and intact. No lesion and no rash noted.  Psychiatric: He has a normal mood and affect. His speech is normal and behavior is normal. Thought content normal.   BP 124/64 mmHg  Pulse 64   Temp(Src) 98.3 F (36.8 C)  Resp 16  Ht 5\' 11"  (1.803 m)  Wt 178 lb (80.74 kg)  BMI 24.84 kg/m2  SpO2 98%  Assessment and Plan:  .1. Pain, dental 2. Acute maxillary sinusitis Etiology unclear - dental infection vs sinusitis. Tenderness along gum lines makes dental infection more likely but xrays at dental office 5 days ago normal. Will treat with augmentin to cover both a sinus infection and a dental infection. If symptoms not resolving after abx or if sx recur after finishing abx, pt should make follow up appt with dentist.  - amoxicillin-clavulanate (AUGMENTIN) 875-125 MG per tablet; Take 1 tablet by mouth 2 (two) times daily.  Dispense: 20 tablet; Refill: 0   Benjaman Pott. Drenda Freeze, MHS [facility]  12/26/2014

## 2015-09-26 ENCOUNTER — Ambulatory Visit (INDEPENDENT_AMBULATORY_CARE_PROVIDER_SITE_OTHER): Payer: BLUE CROSS/BLUE SHIELD | Admitting: Internal Medicine

## 2015-09-26 ENCOUNTER — Encounter: Payer: Self-pay | Admitting: Internal Medicine

## 2015-09-26 VITALS — BP 112/80 | HR 72 | Temp 98.0°F | Ht 71.0 in | Wt 182.4 lb

## 2015-09-26 DIAGNOSIS — R030 Elevated blood-pressure reading, without diagnosis of hypertension: Secondary | ICD-10-CM | POA: Diagnosis not present

## 2015-09-26 DIAGNOSIS — IMO0001 Reserved for inherently not codable concepts without codable children: Secondary | ICD-10-CM

## 2015-09-26 DIAGNOSIS — Z09 Encounter for follow-up examination after completed treatment for conditions other than malignant neoplasm: Secondary | ICD-10-CM | POA: Insufficient documentation

## 2015-09-26 DIAGNOSIS — R519 Headache, unspecified: Secondary | ICD-10-CM

## 2015-09-26 DIAGNOSIS — R51 Headache: Secondary | ICD-10-CM | POA: Diagnosis not present

## 2015-09-26 NOTE — Patient Instructions (Addendum)
GO TO THE LAB :      Get the blood work     GO TO THE FRONT DESK Schedule your next appointment for a  physical When?   3 months Fasting?  Yes    Check the  blood pressure weekly   Be sure your blood pressure is between 110/65 and  145/85. If it is consistently higher or lower, let me know  Low salt diet  Tylenol as needed for headache    Low-Sodium Eating Plan Sodium raises blood pressure and causes water to be held in the body. Getting less sodium from food will help lower your blood pressure, reduce any swelling, and protect your heart, liver, and kidneys. We get sodium by adding salt (sodium chloride) to food. Most of our sodium comes from canned, boxed, and frozen foods. Restaurant foods, fast foods, and pizza are also very high in sodium. Even if you take medicine to lower your blood pressure or to reduce fluid in your body, getting less sodium from your food is important. WHAT IS MY PLAN? Most people should limit their sodium intake to 2,300 mg a day. Your health care provider recommends that you limit your sodium intake to __________ a day.  WHAT DO I NEED TO KNOW ABOUT THIS EATING PLAN? For the low-sodium eating plan, you will follow these general guidelines:  Choose foods with a % Daily Value for sodium of less than 5% (as listed on the food label).   Use salt-free seasonings or herbs instead of table salt or sea salt.   Check with your health care provider or pharmacist before using salt substitutes.   Eat fresh foods.  Eat more vegetables and fruits.  Limit canned vegetables. If you do use them, rinse them well to decrease the sodium.   Limit cheese to 1 oz (28 g) per day.   Eat lower-sodium products, often labeled as "lower sodium" or "no salt added."  Avoid foods that contain monosodium glutamate (MSG). MSG is sometimes added to [place] food and some canned foods.  Check food labels (Nutrition Facts labels) on foods to learn how much sodium is in one  serving.  Eat more home-cooked food and less restaurant, buffet, and fast food.  When eating at a restaurant, ask that your food be prepared with less salt, or no salt if possible.  HOW DO I READ FOOD LABELS FOR SODIUM INFORMATION? The Nutrition Facts label lists the amount of sodium in one serving of the food. If you eat more than one serving, you must multiply the listed amount of sodium by the number of servings. Food labels may also identify foods as:  Sodium free--Less than 5 mg in a serving.  Very low sodium--35 mg or less in a serving.  Low sodium--140 mg or less in a serving.  Light in sodium--50% less sodium in a serving. For example, if a food that usually has 300 mg of sodium is changed to become light in sodium, it will have 150 mg of sodium.  Reduced sodium--25% less sodium in a serving. For example, if a food that usually has 400 mg of sodium is changed to reduced sodium, it will have 300 mg of sodium. WHAT FOODS CAN I EAT? Grains Low-sodium cereals, including oats, puffed wheat and rice, and shredded wheat cereals. Low-sodium crackers. Unsalted rice and pasta. Lower-sodium bread.  Vegetables Frozen or fresh vegetables. Low-sodium or reduced-sodium canned vegetables. Low-sodium or reduced-sodium tomato sauce and paste. Low-sodium or reduced-sodium tomato and vegetable juices.  Fruits Fresh, frozen, and canned fruit. Fruit juice.  Meat and Other Protein Products Low-sodium canned tuna and salmon. Fresh or frozen meat, poultry, seafood, and fish. Lamb. Unsalted nuts. Dried beans, peas, and lentils without added salt. Unsalted canned beans. Homemade soups without salt. Eggs.  Dairy Milk. Soy milk. Ricotta cheese. Low-sodium or reduced-sodium cheeses. Yogurt.  Condiments Fresh and dried herbs and spices. Salt-free seasonings. Onion and garlic powders. Low-sodium varieties of mustard and ketchup. Fresh or refrigerated horseradish. Lemon juice.  Fats and  Oils Reduced-sodium salad dressings. Unsalted butter.  Other Unsalted popcorn and pretzels.  The items listed above may not be a complete list of recommended foods or beverages. Contact your dietitian for more options. WHAT FOODS ARE NOT RECOMMENDED? Grains Instant hot cereals. Bread stuffing, pancake, and biscuit mixes. Croutons. Seasoned rice or pasta mixes. Noodle soup cups. Boxed or frozen macaroni and cheese. Self-rising flour. Regular salted crackers. Vegetables Regular canned vegetables. Regular canned tomato sauce and paste. Regular tomato and vegetable juices. Frozen vegetables in sauces. Salted [place] fries. Olives. Angie Fava. Relishes. Sauerkraut. Salsa. Meat and Other Protein Products Salted, canned, smoked, spiced, or pickled meats, seafood, or fish. Bacon, ham, sausage, hot dogs, corned beef, chipped beef, and packaged luncheon meats. Salt pork. Jerky. Pickled herring. Anchovies, regular canned tuna, and sardines. Salted nuts. Dairy Processed cheese and cheese spreads. Cheese curds. Blue cheese and cottage cheese. Buttermilk.  Condiments Onion and garlic salt, seasoned salt, table salt, and sea salt. Canned and packaged gravies. Worcestershire sauce. Tartar sauce. Barbecue sauce. Teriyaki sauce. Soy sauce, including reduced sodium. Steak sauce. Fish sauce. Oyster sauce. Cocktail sauce. Horseradish that you find on the shelf. Regular ketchup and mustard. Meat flavorings and tenderizers. Bouillon cubes. Hot sauce. Tabasco sauce. Marinades. Taco seasonings. Relishes. Fats and Oils Regular salad dressings. Salted butter. Margarine. Ghee. Bacon fat.  Other Potato and tortilla chips. Corn chips and puffs. Salted popcorn and pretzels. Canned or dried soups. Pizza. Frozen entrees and pot pies.  The items listed above may not be a complete list of foods and beverages to avoid. Contact your dietitian for more information.   This information is not intended to replace advice given  to you by your health care provider. Make sure you discuss any questions you have with your health care provider.   Document Released: 11/30/2001 Document Revised: 07/01/2014 Document Reviewed: 04/14/2013 Elsevier Interactive Patient Education [employer].

## 2015-09-26 NOTE — Progress Notes (Signed)
Pre visit review using our clinic review tool, if applicable. No additional management support is needed unless otherwise documented below in the visit note. 

## 2015-09-26 NOTE — Progress Notes (Signed)
   Subjective:    Patient ID: Jeremiah [name], male    DOB: April 12, 1956, 60 y.o.   MRN: [id number]  DOS:  09/26/2015 Type of visit - description : Acute visit Interval history: A couple of days ago, he felt slightly flushed in the face, yesterday he decided to take his blood pressure at the pharmacy and it was 149/96. No readings since then. Feels well in general, has a mild headache for the last few days, left-sided, no associated nausea, vomiting, not the worst of his life.   Review of Systems Denies chest pain, difficulty breathing or lower extremity edema He is extremely active, has a Education administrator, no exertional chest pain or difficulty breathing. Is very busy, some stress but no actual anxiety depression No diplopia, slurred speech or motor deficits.   Past Medical History  Diagnosis Date  . Hemorrhoids   . Erythema nodosum     per Bx 06-2010, d/t a virus, saw ID and rheumatology    Past Surgical History  Procedure Laterality Date  . Refractive surgery      Social History   Social History  . Marital Status: Married    Spouse Name: N/A  . Number of Children: 2  . Years of Education: N/A   Occupational History  . WEB STUDENT MINISTRY    Social History Main Topics  . Smoking status: Never Smoker   . Smokeless tobacco: Never Used  . Alcohol Use: Yes     Comment: socially   . Drug Use: No  . Sexual Activity: Not on file   Other Topics Concern  . Not on file   Social History Narrative   Goes by  [name]"---   Very active, outdoors               Medication List    Notice  As of 09/26/2015  9:08 PM   You have not been prescribed any medications.         Objective:   Physical Exam BP 112/80 mmHg  Pulse 72  Temp(Src) 98 F (36.7 C) (Oral)  Ht 5\' 11"  (1.803 m)  Wt 182 lb 6 oz (82.725 kg)  BMI 25.45 kg/m2  SpO2 99% General:   Well developed, well nourished . NAD.  HEENT:  Normocephalic . Face symmetric, atraumatic. Neck: No thyromegaly,  normal carotid pulses Lungs:  CTA B Normal respiratory effort, no intercostal retractions, no accessory muscle use. Heart: RRR,  no murmur.  no pretibial edema bilaterally  Abdomen:  Not distended, soft, non-tender. No rebound or rigidity.  Skin: Not pale. Not jaundice Neurologic:  alert & oriented X3.  Speech normal, gait appropriate for age and unassisted Psych--  Cognition and judgment appear intact.  Cooperative with normal attention span and concentration.  Behavior appropriate. No anxious or depressed appearing.    Assessment & Plan:   Assessment Erythema nodosum, + biopsy 2012, d/t a virus, saw ID and rheumatology  Plan: Elevated BP 1 EKG normal sinus rhythm, and at  baseline Plan: Low salt diet, BMP, CBC Monitor BPs, see instructions HAs: Recommend Tylenol as needed, call if symptoms severe RTC 3 months

## 2015-09-26 NOTE — Assessment & Plan Note (Signed)
Elevated BP 1 EKG normal sinus rhythm, and at  baseline Plan: Low salt diet, BMP, CBC Monitor BPs, see instructions HAs: Recommend Tylenol as needed, call if symptoms severe RTC 3 months

## 2015-09-27 LAB — BASIC METABOLIC PANEL
BUN: 19 mg/dL (ref 6–23)
CO2: 30 mEq/L (ref 19–32)
Calcium: 9.6 mg/dL (ref 8.4–10.5)
Chloride: 102 mEq/L (ref 96–112)
Creatinine, Ser: 1.02 mg/dL (ref 0.40–1.50)
GFR: 79.51 mL/min (ref >60.00)
Glucose, Bld: 77 mg/dL (ref 70–99)
Potassium: 4.4 mEq/L (ref 3.5–5.1)
Sodium: 140 mEq/L (ref 135–145)

## 2016-01-05 ENCOUNTER — Ambulatory Visit (INDEPENDENT_AMBULATORY_CARE_PROVIDER_SITE_OTHER): Payer: BLUE CROSS/BLUE SHIELD | Admitting: Internal Medicine

## 2016-01-05 ENCOUNTER — Encounter: Payer: Self-pay | Admitting: Internal Medicine

## 2016-01-05 VITALS — BP 108/68 | HR 50 | Temp 97.8°F | Ht 71.0 in | Wt 184.2 lb

## 2016-01-05 DIAGNOSIS — Z23 Encounter for immunization: Secondary | ICD-10-CM

## 2016-01-05 DIAGNOSIS — Z0000 Encounter for general adult medical examination without abnormal findings: Secondary | ICD-10-CM | POA: Diagnosis not present

## 2016-01-05 DIAGNOSIS — Z125 Encounter for screening for malignant neoplasm of prostate: Secondary | ICD-10-CM | POA: Diagnosis not present

## 2016-01-05 LAB — CBC WITH DIFFERENTIAL/PLATELET
Basophils Absolute: 0 10*3/uL (ref 0.0–0.1)
Basophils Relative: 0.2 % (ref 0.0–3.0)
Eosinophils Absolute: 0.6 10*3/uL (ref 0.0–0.7)
Eosinophils Relative: 9.3 % — ABNORMAL HIGH (ref 0.0–5.0)
HCT: 45 % (ref 39.0–52.0)
Hemoglobin: 15.2 g/dL (ref 13.0–17.0)
Lymphocytes Relative: 26.6 % (ref 12.0–46.0)
Lymphs Abs: 1.7 10*3/uL (ref 0.7–4.0)
MCHC: 33.7 g/dL (ref 30.0–36.0)
MCV: 89.2 fl (ref 78.0–100.0)
Monocytes Absolute: 0.5 10*3/uL (ref 0.1–1.0)
Monocytes Relative: 7.9 % (ref 3.0–12.0)
Neutro Abs: 3.5 10*3/uL (ref 1.4–7.7)
Neutrophils Relative %: 56 % (ref 43.0–77.0)
Platelets: 249 10*3/uL (ref 150.0–400.0)
RBC: 5.04 Mil/uL (ref 4.22–5.81)
RDW: 13.3 % (ref 11.5–15.5)
WBC: 6.2 10*3/uL (ref 4.0–10.5)

## 2016-01-05 LAB — LIPID PANEL
Cholesterol: 199 mg/dL (ref 0–200)
HDL: 37.3 mg/dL — ABNORMAL LOW (ref >39.00)
LDL Cholesterol: 134 mg/dL — ABNORMAL HIGH (ref 0–99)
NonHDL: 162.17
Total CHOL/HDL Ratio: 5
Triglycerides: 139 mg/dL (ref 0.0–149.0)
VLDL: 27.8 mg/dL (ref 0.0–40.0)

## 2016-01-05 LAB — AST: AST: 25 U/L (ref 0–37)

## 2016-01-05 LAB — ALT: ALT: 19 U/L (ref 0–53)

## 2016-01-05 LAB — TSH: TSH: 1.03 u[IU]/mL (ref 0.35–4.50)

## 2016-01-05 LAB — PSA: PSA: 0.62 ng/mL (ref 0.10–4.00)

## 2016-01-05 NOTE — Patient Instructions (Signed)
GO TO THE LAB : Get the blood work     GO TO THE FRONT DESK Schedule your next appointment for a  complete physical exam in one year     Check the  blood pressure   monthly   Be sure your blood pressure is between 110/65 and  145/85. If it is consistently higher or lower, let me know    

## 2016-01-05 NOTE — Progress Notes (Signed)
Subjective:    Patient ID: Jeremiah [name], male    DOB: 17-Feb-1956, 60 y.o.   MRN: [id number]  DOS:  01/05/2016 Type of visit - description : CPX Interval history: No major concerns, he continue to have a very healthy lifestyle  Review of Systems Constitutional: No fever. No chills. No unexplained wt changes. No unusual sweats  HEENT: No dental problems, no ear discharge, no facial swelling, no voice changes. No eye discharge, no eye  redness , no  intolerance to light   Respiratory: No wheezing , no  difficulty breathing. No cough , no mucus production  Cardiovascular: No CP, no leg swelling , no  Palpitations  GI: no nausea, no vomiting, no diarrhea , no  abdominal pain.  No blood in the stools. No dysphagia, no odynophagia    Endocrine: No polyphagia, no polyuria , no polydipsia  GU: No dysuria, gross hematuria, difficulty urinating. No urinary urgency, no frequency.  Musculoskeletal: No joint swellings or unusual aches or pains  Skin: No change in the color of the skin, palor , no  Rash  Allergic, immunologic: No environmental allergies , no  food allergies  Neurological: No dizziness no  syncope. No headaches. No diplopia, no slurred, no slurred speech, no motor deficits, no facial  Numbness  Hematological: No enlarged lymph nodes, no easy bruising , no unusual bleedings  Psychiatry: No suicidal ideas, no hallucinations, no beavior problems, no confusion.  No unusual/severe anxiety, no depression   Past Medical History  Diagnosis Date  . Hemorrhoids   . Erythema nodosum     per Bx 06-2010, d/t a virus, saw ID and rheumatology    Past Surgical History  Procedure Laterality Date  . Refractive surgery      Social History   Social History  . Marital Status: Married    Spouse Name: N/A  . Number of Children: 2  . Years of Education: N/A   Occupational History  . WEB STUDENT MINISTRY    Social History Main Topics  . Smoking status: Never Smoker   .  Smokeless tobacco: Never Used  . Alcohol Use: Yes     Comment: socially   . Drug Use: No  . Sexual Activity: Not on file   Other Topics Concern  . Not on file   Social History Narrative   Goes by  [name]"---   Very active, outdoors            Family History  Problem Relation Age of Onset  . Hyperlipidemia Mother     F, M  . Transient ischemic attack Mother     age 61  . Dementia      F, aunts  . Heart disease Father     F had a MI age 72  . Colon cancer Neg Hx   . Prostate cancer Neg Hx   . CAD Other     GF, > 24 y/o       Medication List    Notice  As of 01/05/2016 10:44 AM   You have not been prescribed any medications.         Objective:   Physical Exam BP 108/68 mmHg  Pulse 50  Temp(Src) 97.8 F (36.6 C) (Oral)  Ht 5\' 11"  (1.803 m)  Wt 184 lb 4 oz (83.575 kg)  BMI 25.71 kg/m2  SpO2 98%  General:   Well developed, well nourished . NAD.  Neck: No  thyromegaly  HEENT:  Normocephalic . Face symmetric, atraumatic  Lungs:  CTA B Normal respiratory effort, no intercostal retractions, no accessory muscle use. Heart: RRR,  no murmur.  No pretibial edema bilaterally  Abdomen:  Not distended, soft, non-tender. No rebound or rigidity.   Skin: Exposed areas without rash. Not pale. Not jaundice Rectal:  External abnormalities: none. Normal sphincter tone. No rectal masses or tenderness.  Stool brown  Prostate: Prostate gland firm and smooth, no enlargement, nodularity, tenderness, mass, asymmetry or induration.  Neurologic:  alert & oriented X3.  Speech normal, gait appropriate for age and unassisted Strength symmetric and appropriate for age.  Psych: Cognition and judgment appear intact.  Cooperative with normal attention span and concentration.  Behavior appropriate. No anxious or depressed appearing.    Assessment & Plan:   Assessment Erythema nodosum, + biopsy 2012, d/t a virus, saw ID and rheumatology  PLAN: Elevated BP: See last OV,  since then BP is normal when checked. RTC one year

## 2016-01-05 NOTE — Assessment & Plan Note (Signed)
Elevated BP: See last OV, since then BP is normal when checked. RTC one year

## 2016-01-05 NOTE — Assessment & Plan Note (Addendum)
Td 12-2015 Cscope  2001, negative per patient (done @ [facility]),again in 2012, next cscope 5 to 10 years and start hemocults 2017 ( per report)  : Hemoccults provided today. Discussed pros and cons of prostate cancer screening: DRE (-) today, check a  PSA  Labs -- AST, ALT, FLP, CBC, TSH, PSA Cont healthy life style  RTC one year

## 2016-01-05 NOTE — Progress Notes (Signed)
Pre visit review using our clinic review tool, if applicable. No additional management support is needed unless otherwise documented below in the visit note. 

## 2017-02-02 DIAGNOSIS — L255 Unspecified contact dermatitis due to plants, except food: Secondary | ICD-10-CM | POA: Diagnosis not present

## 2017-04-11 ENCOUNTER — Encounter: Payer: Self-pay | Admitting: Internal Medicine

## 2017-04-11 ENCOUNTER — Ambulatory Visit (INDEPENDENT_AMBULATORY_CARE_PROVIDER_SITE_OTHER): Payer: BLUE CROSS/BLUE SHIELD | Admitting: Internal Medicine

## 2017-04-11 VITALS — BP 124/62 | HR 69 | Temp 98.3°F | Resp 14 | Ht 71.0 in | Wt 187.0 lb

## 2017-04-11 DIAGNOSIS — Z23 Encounter for immunization: Secondary | ICD-10-CM | POA: Diagnosis not present

## 2017-04-11 DIAGNOSIS — Z0000 Encounter for general adult medical examination without abnormal findings: Secondary | ICD-10-CM | POA: Diagnosis not present

## 2017-04-11 NOTE — Patient Instructions (Signed)
  GO TO THE FRONT DESK Schedule labs to be done next week, fasting  Schedule your next appointment for a physical exam in one year   Call if the skin lesion in the nose changes  Check the  blood pressure   monthly   Be sure your blood pressure is between 110/65 and  135/85. If it is consistently higher or lower, let me know

## 2017-04-11 NOTE — Progress Notes (Signed)
Subjective:    Patient ID: Jeremiah [name], male    DOB: 01-03-1956, 61 y.o.   MRN: [id number]  DOS:  04/11/2017 Type of visit - description : cpx Interval history:  He remains active and eat healthy. Feels great  Review of Systems Earlier this year his face got  flushed few times , BP was checked few times and he was around 125/85, higher than his usual. Symptoms self resolved within few days At the time did not have fever, chills, chest pain or palpitations. Also noted a "spot" on the nose last month  Other than above, a 14 point review of systems is negative     Past Medical History:  Diagnosis Date  . Erythema nodosum    per Bx 06-2010, d/t a virus, saw ID and rheumatology  . Hemorrhoids     Past Surgical History:  Procedure Laterality Date  . REFRACTIVE SURGERY      Social History   Social History  . Marital status: Married    Spouse name: N/A  . Number of children: 2  . 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 Smoking status: Never Smoker  . Smokeless tobacco: Never Used  . Alcohol use Yes     Comment: socially   . Drug use: No  . Sexual activity: Not on file   Other Topics Concern  . Not on file   Social History Narrative   Goes by  [name]"---   Very active, outdoors            Family History  Problem Relation Age of Onset  . Heart disease Father        F had a MI age 62  . Hyperlipidemia Mother        F, M  . Transient ischemic attack Mother        age 49  . Dementia Unknown        F, aunts  . CAD Other        GF, > 27 y/o  . Colon cancer Neg Hx   . Prostate cancer Neg Hx   . Skin cancer Neg Hx      Allergies as of 04/11/2017   No Known Allergies     Medication List    as of 04/11/2017 11:59 PM   You have not been prescribed any medications.        Objective:   Physical Exam  HENT:  Head:     BP 124/62 (BP Location: Left Arm, Patient  Position: Sitting, Cuff Size: Small)   Pulse 69   Temp 98.3 F (36.8 C) (Oral)   Resp 14   Ht 5\' 11"  (1.803 m)   Wt 187 lb (84.8 kg)   SpO2 97%   BMI 26.08 kg/m  General:   Well developed, well nourished . NAD.  Neck: No  thyromegaly  HEENT:  Normocephalic . Face symmetric, atraumatic Lungs:  CTA B Normal respiratory effort, no intercostal retractions, no accessory muscle use. Heart: RRR,  no murmur.  No pretibial edema bilaterally  Abdomen:  Not distended, soft, non-tender. No rebound or rigidity.   Skin: Exposed areas without rash. Not pale. Not jaundice Neurologic:  alert & oriented X3.  Speech normal, gait appropriate for age and unassisted Strength symmetric and appropriate for age.  Psych: Cognition and judgment appear intact.  Cooperative with normal attention span and concentration.  Behavior appropriate. No anxious or depressed appearing.     Assessment & Plan:   Assessment Erythema nodosum, + biopsy 2012, d/t a virus, saw ID and rheumatology  PLAN: Doing well Skin lesion, nose: discuss obs vs referral, elected observation; call for a referral if it gets darker, larger, irritated, bleeding. RTC one year

## 2017-04-11 NOTE — Progress Notes (Signed)
Pre visit review using our clinic review tool, if applicable. No additional management support is needed unless otherwise documented below in the visit note. 

## 2017-04-11 NOTE — Assessment & Plan Note (Addendum)
Td 12-2015; flu shot benefits discussed , got it today -CCS: Cscope  2001, negative per patient (done @ [facility]),cscope again in 2012, next cscope 5 to 10 years and start hemocults 2017 (per report): Hemoccults provided last year, not returned, provided another today  -prostate cancer screening: DRE/ PSA  wnl 2017 -Labs: CMP, FLP, CBC. Will come back fasting. -Cont healthy life style  RTC one year

## 2017-04-13 ENCOUNTER — Encounter: Payer: Self-pay | Admitting: Internal Medicine

## 2017-04-13 NOTE — Assessment & Plan Note (Signed)
Doing well Skin lesion, nose: discuss obs vs referral, elected observation; call for a referral if it gets darker, larger, irritated, bleeding. RTC one year

## 2017-04-17 ENCOUNTER — Other Ambulatory Visit (INDEPENDENT_AMBULATORY_CARE_PROVIDER_SITE_OTHER): Payer: BLUE CROSS/BLUE SHIELD

## 2017-04-17 DIAGNOSIS — Z0000 Encounter for general adult medical examination without abnormal findings: Secondary | ICD-10-CM

## 2017-04-17 LAB — CBC WITH DIFFERENTIAL/PLATELET
Basophils Absolute: 0 10*3/uL (ref 0.0–0.1)
Basophils Relative: 0.9 % (ref 0.0–3.0)
Eosinophils Absolute: 0.4 10*3/uL (ref 0.0–0.7)
Eosinophils Relative: 8.1 % — ABNORMAL HIGH (ref 0.0–5.0)
HCT: 44 % (ref 39.0–52.0)
Hemoglobin: 14.7 g/dL (ref 13.0–17.0)
Lymphocytes Relative: 30 % (ref 12.0–46.0)
Lymphs Abs: 1.6 10*3/uL (ref 0.7–4.0)
MCHC: 33.3 g/dL (ref 30.0–36.0)
MCV: 91.1 fl (ref 78.0–100.0)
Monocytes Absolute: 0.4 10*3/uL (ref 0.1–1.0)
Monocytes Relative: 8.3 % (ref 3.0–12.0)
Neutro Abs: 2.8 10*3/uL (ref 1.4–7.7)
Neutrophils Relative %: 52.7 % (ref 43.0–77.0)
Platelets: 248 10*3/uL (ref 150.0–400.0)
RBC: 4.84 Mil/uL (ref 4.22–5.81)
RDW: 13.1 % (ref 11.5–15.5)
WBC: 5.3 10*3/uL (ref 4.0–10.5)

## 2017-04-17 LAB — COMPREHENSIVE METABOLIC PANEL
ALT: 22 U/L (ref 0–53)
AST: 28 U/L (ref 0–37)
Albumin: 4.2 g/dL (ref 3.5–5.2)
Alkaline Phosphatase: 60 U/L (ref 39–117)
BUN: 16 mg/dL (ref 6–23)
CO2: 32 mEq/L (ref 19–32)
Calcium: 9.3 mg/dL (ref 8.4–10.5)
Chloride: 102 mEq/L (ref 96–112)
Creatinine, Ser: 1.16 mg/dL (ref 0.40–1.50)
GFR: 68.18 mL/min (ref >60.00)
Glucose, Bld: 84 mg/dL (ref 70–99)
Potassium: 4.4 mEq/L (ref 3.5–5.1)
Sodium: 139 mEq/L (ref 135–145)
Total Bilirubin: 0.7 mg/dL (ref 0.2–1.2)
Total Protein: 6.9 g/dL (ref 6.0–8.3)

## 2017-04-17 LAB — LIPID PANEL
Cholesterol: 170 mg/dL (ref 0–200)
HDL: 38 mg/dL — ABNORMAL LOW (ref >39.00)
LDL Cholesterol: 111 mg/dL — ABNORMAL HIGH (ref 0–99)
NonHDL: 131.65
Total CHOL/HDL Ratio: 4
Triglycerides: 101 mg/dL (ref 0.0–149.0)
VLDL: 20.2 mg/dL (ref 0.0–40.0)

## 2018-04-13 ENCOUNTER — Encounter: Payer: Self-pay | Admitting: Internal Medicine

## 2018-04-13 ENCOUNTER — Ambulatory Visit (INDEPENDENT_AMBULATORY_CARE_PROVIDER_SITE_OTHER): Payer: BLUE CROSS/BLUE SHIELD | Admitting: Internal Medicine

## 2018-04-13 VITALS — BP 126/70 | HR 60 | Temp 97.9°F | Resp 16 | Ht 71.0 in | Wt 183.0 lb

## 2018-04-13 DIAGNOSIS — Z0000 Encounter for general adult medical examination without abnormal findings: Secondary | ICD-10-CM

## 2018-04-13 DIAGNOSIS — Z23 Encounter for immunization: Secondary | ICD-10-CM

## 2018-04-13 DIAGNOSIS — Z0001 Encounter for general adult medical examination with abnormal findings: Secondary | ICD-10-CM | POA: Diagnosis not present

## 2018-04-13 DIAGNOSIS — L989 Disorder of the skin and subcutaneous tissue, unspecified: Secondary | ICD-10-CM | POA: Diagnosis not present

## 2018-04-13 LAB — CBC WITH DIFFERENTIAL/PLATELET
Basophils Absolute: 0 10*3/uL (ref 0.0–0.1)
Basophils Relative: 0.7 % (ref 0.0–3.0)
Eosinophils Absolute: 0.3 10*3/uL (ref 0.0–0.7)
Eosinophils Relative: 5.7 % — ABNORMAL HIGH (ref 0.0–5.0)
HCT: 41.3 % (ref 39.0–52.0)
Hemoglobin: 14.1 g/dL (ref 13.0–17.0)
Lymphocytes Relative: 26.3 % (ref 12.0–46.0)
Lymphs Abs: 1.2 10*3/uL (ref 0.7–4.0)
MCHC: 34.1 g/dL (ref 30.0–36.0)
MCV: 89.8 fl (ref 78.0–100.0)
Monocytes Absolute: 0.4 10*3/uL (ref 0.1–1.0)
Monocytes Relative: 9.4 % (ref 3.0–12.0)
Neutro Abs: 2.7 10*3/uL (ref 1.4–7.7)
Neutrophils Relative %: 57.9 % (ref 43.0–77.0)
Platelets: 229 10*3/uL (ref 150.0–400.0)
RBC: 4.6 Mil/uL (ref 4.22–5.81)
RDW: 13.4 % (ref 11.5–15.5)
WBC: 4.6 10*3/uL (ref 4.0–10.5)

## 2018-04-13 LAB — COMPREHENSIVE METABOLIC PANEL
ALT: 19 U/L (ref 0–53)
AST: 27 U/L (ref 0–37)
Albumin: 4.4 g/dL (ref 3.5–5.2)
Alkaline Phosphatase: 55 U/L (ref 39–117)
BUN: 16 mg/dL (ref 6–23)
CO2: 31 mEq/L (ref 19–32)
Calcium: 9.2 mg/dL (ref 8.4–10.5)
Chloride: 103 mEq/L (ref 96–112)
Creatinine, Ser: 1.08 mg/dL (ref 0.40–1.50)
GFR: 73.79 mL/min (ref >60.00)
Glucose, Bld: 81 mg/dL (ref 70–99)
Potassium: 4.6 mEq/L (ref 3.5–5.1)
Sodium: 140 mEq/L (ref 135–145)
Total Bilirubin: 0.8 mg/dL (ref 0.2–1.2)
Total Protein: 6.6 g/dL (ref 6.0–8.3)

## 2018-04-13 LAB — LIPID PANEL
Cholesterol: 162 mg/dL (ref 0–200)
HDL: 40.6 mg/dL (ref >39.00)
LDL Cholesterol: 100 mg/dL — ABNORMAL HIGH (ref 0–99)
NonHDL: 121.07
Total CHOL/HDL Ratio: 4
Triglycerides: 104 mg/dL (ref 0.0–149.0)
VLDL: 20.8 mg/dL (ref 0.0–40.0)

## 2018-04-13 LAB — PSA: PSA: 0.49 ng/mL (ref 0.10–4.00)

## 2018-04-13 NOTE — Patient Instructions (Signed)
GO TO THE LAB : Get the blood work     GO TO THE FRONT DESK Schedule your next appointment for a physical exam in 1 year  Please return the stool test at your convenience   Consider at some point Shingrix

## 2018-04-13 NOTE — Progress Notes (Signed)
Pre visit review using our clinic review tool, if applicable. No additional management support is needed unless otherwise documented below in the visit note. 

## 2018-04-13 NOTE — Assessment & Plan Note (Addendum)
-  Td 12-2015; shingrix  Discussed ; flu shot   today -CCS: Cscope  2001, negative per patient (done @ [facility]),cscope again in 2012, next cscope 5 to 10 years and start hemocults 2017 (per report): Hemoccults provided  today  -prostate cancer screening: DRE wnl, check a PSA -Labs: CMP, FLP, CBC, PSA (no fasting) -Cont healthy life style ; training hard to get down to the [facility]. RTC one year

## 2018-04-13 NOTE — Progress Notes (Signed)
Subjective:    Patient ID: Jeremiah [name], male    DOB: 24-Mar-1956, 62 y.o.   MRN: [id number]  DOS:  04/13/2018 Type of visit - description : cpx Interval history: Feeling great.   Review of Systems Skin lesion for few weeks at the right temple.  Other than above, a 14 point review of systems is negative    Past Medical History:  Diagnosis Date  . Erythema nodosum    per Bx 06-2010, d/t a virus, saw ID and rheumatology  . Hemorrhoids     Past Surgical History:  Procedure Laterality Date  . REFRACTIVE SURGERY      Social History   Socioeconomic History  . Marital status: Married    Spouse name: Not on file  . Number of children: 2  . Years of education: Not on file  . Highest education level: Not on file  Occupational History  . 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 Financial resource strain: Not on file  . Food insecurity:    Worry: Not on file    Inability: Not on file  . Transportation needs:    Medical: Not on file    Non-medical: Not on file  Tobacco Use  . Smoking status: Never Smoker  . Smokeless tobacco: Never Used  Substance and Sexual Activity  . Alcohol use: Yes    Comment: socially   . Drug use: No  . Sexual activity: Not on file  Lifestyle  . Physical activity:    Days per week: Not on file    Minutes per session: Not on file  . Stress: Not on file  Relationships  . Social connections:    Talks on phone: Not on file    Gets together: Not on file    Attends religious service: Not on file    Active member of club or organization: Not on file    Attends meetings of clubs or organizations: Not on file    Relationship status: Not on file  . Intimate partner violence:    Fear of current or ex partner: Not on file    Emotionally abused: Not on file    Physically abused: Not on file    Forced sexual activity: Not on file  Other Topics Concern  . Not on file  Social History Narrative   Goes by  [name]"    Very active, outdoors         Family History  Problem Relation Age of Onset  . Heart disease Father        F had a MI age 9  . Hyperlipidemia Mother        F, M  . Transient ischemic attack Mother        age 41  . Dementia Unknown        F, aunts  . CAD Other        GF, > 19 y/o  . Colon cancer Neg Hx   . Prostate cancer Neg Hx   . Skin cancer Neg Hx      Allergies as of 04/13/2018   No Known Allergies     Medication List    as of 04/13/2018 11:59 PM   You have not been prescribed any medications.        Objective:   Physical Exam  HENT:  Head:     BP 126/70 (BP Location: Left Arm, Patient Position: Sitting, Cuff Size: Small)   Pulse 60  Temp 97.9 F (36.6 C) (Oral)   Resp 16   Ht 5\' 11"  (1.803 m)   Wt 183 lb (83 kg)   SpO2 97%   BMI 25.52 kg/m  General: Well developed, NAD, see BMI.  Neck: No  thyromegaly  HEENT:  Normocephalic . Face symmetric, atraumatic Lungs:  CTA B Normal respiratory effort, no intercostal retractions, no accessory muscle use. Heart: RRR,  no murmur.  No pretibial edema bilaterally  Abdomen:  Not distended, soft, non-tender. No rebound or rigidity.  Rectal: Normal sphincter tone. No rectal masses or tenderness.  Brown stools Prostate: Prostate gland firm and smooth, no enlargement, nodularity, tenderness, mass, asymmetry or induration Neurologic:  alert & oriented X3.  Speech normal, gait appropriate for age and unassisted Strength symmetric and appropriate for age.  Psych: Cognition and judgment appear intact.  Cooperative with normal attention span and concentration.  Behavior appropriate. No anxious or depressed appearing.     Assessment & Plan:   Assessment Erythema nodosum, + biopsy 2012, d/t a virus, saw ID and rheumatology  PLAN: Here for CPX, doing well Skin lesion: At the temple, refer to dermatology, no urgent, SCC?. RTC 1 year

## 2018-04-14 NOTE — Assessment & Plan Note (Signed)
Here for CPX, doing well Skin lesion: At the temple, refer to dermatology, no urgent, SCC?. RTC 1 year

## 2018-04-17 DIAGNOSIS — L570 Actinic keratosis: Secondary | ICD-10-CM | POA: Diagnosis not present

## 2018-12-21 ENCOUNTER — Telehealth: Payer: Self-pay | Admitting: Internal Medicine

## 2018-12-21 ENCOUNTER — Ambulatory Visit (INDEPENDENT_AMBULATORY_CARE_PROVIDER_SITE_OTHER): Payer: No Typology Code available for payment source | Admitting: Internal Medicine

## 2018-12-21 ENCOUNTER — Other Ambulatory Visit: Payer: Self-pay

## 2018-12-21 DIAGNOSIS — Z20828 Contact with and (suspected) exposure to other viral communicable diseases: Secondary | ICD-10-CM

## 2018-12-21 DIAGNOSIS — Z20822 Contact with and (suspected) exposure to covid-19: Secondary | ICD-10-CM

## 2018-12-21 NOTE — Telephone Encounter (Signed)
Pt just found out exposed to covid positive pt on Saturday and wants Dr Larose Kells nurse to f\u up concerning possible ordering testing. 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 FU

## 2018-12-21 NOTE — Telephone Encounter (Signed)
Needs virtual visit please.  

## 2018-12-21 NOTE — Progress Notes (Signed)
Subjective:    Patient ID: Jeremiah [name], male    DOB: Apr 08, 1956, 63 y.o.   MRN: [id number]  DOS:  12/21/2018 Type of visit - description: Virtual Visit via Video Note  I connected with@ on 12/21/18 at  4:00 PM EDT by a video enabled telemedicine application and verified that I am speaking with the correct person using two identifiers.   THIS ENCOUNTER IS A VIRTUAL VISIT DUE TO COVID-19 - PATIENT WAS NOT SEEN IN THE OFFICE. PATIENT HAS CONSENTED TO VIRTUAL VISIT / TELEMEDICINE VISIT   Location of patient: home  Location of provider: office  I discussed the limitations of evaluation and management by telemedicine and the availability of in person appointments. The patient expressed understanding and agreed to proceed.  History of Present Illness: Acute About 8 days ago, a coworker was sick, he was tested and the test came back positive for coronavirus. 12 other people were exposed, they are asymptomatic to his knowledge. The last time he was in contact with 1 of those people was 5 days ago. He wonders if he needs to be tested.   Review of Systems  Denies fever chills No chest pain or difficulty breathing No nausea, vomiting, diarrhea Past Medical History:  Diagnosis Date  . Actinic keratitis   . Erythema nodosum    per Bx 06-2010, d/t a virus, saw ID and rheumatology  . Hemorrhoids     Past Surgical History:  Procedure Laterality Date  . REFRACTIVE SURGERY      Social History   Socioeconomic History  . Marital status: Married    Spouse name: Not on file  . Number of children: 2  . Years of education: Not on file  . Highest education level: Not on file  Occupational History  . 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 Financial resource strain: Not on file  . Food insecurity    Worry: Not on file    Inability: Not on file  . Transportation needs    Medical: Not on file    Non-medical: Not on file  Tobacco Use  . Smoking status:  Never Smoker  . Smokeless tobacco: Never Used  Substance and Sexual Activity  . Alcohol use: Yes    Comment: socially   . Drug use: No  . Sexual activity: Not on file  Lifestyle  . Physical activity    Days per week: Not on file    Minutes per session: Not on file  . Stress: Not on file  Relationships  . Social Herbalist on phone: Not on file    Gets together: Not on file    Attends religious service: Not on file    Active member of club or organization: Not on file    Attends meetings of clubs or organizations: Not on file    Relationship status: Not on file  . Intimate partner violence    Fear of current or ex partner: Not on file    Emotionally abused: Not on file    Physically abused: Not on file    Forced sexual activity: Not on file  Other Topics Concern  . Not on file  Social History Narrative   Goes by  "[name]"    Very active, outdoors          Allergies as of 12/21/2018   No Known Allergies     Medication List    as of December 21, 2018  4:19  PM   You have not been prescribed any medications.         Objective:   Physical Exam There were no vitals taken for this visit. Alert oriented x3, no apparent distress.    Assessment     Assessment Erythema nodosum, + biopsy 2012, d/t a virus, saw ID and rheumatology  PLAN: COVID-19 exposure: As described above, the last contact with somebody potentially COVID-19 positive was approximately 5 days ago. He is already quarantine, recommend to continue to do so. If in 5 additional days he is asymptomatic and his COVID-19 testing came back negative then I will consider him not contagious. If he has minor symptoms, needs to be treated at home, if he has more severe symptoms (high fever, chills, chest pain, shortness of breath) he needs to contact the office right away. We will notify the [facility] community testing site to call the patient.     I discussed the assessment and treatment plan with the patient.  The patient was provided an opportunity to ask questions and all were answered. The patient agreed with the plan and demonstrated an understanding of the instructions.   The patient was advised to call back or seek an in-person evaluation if the symptoms worsen or if the condition fails to improve as anticipated.

## 2018-12-22 ENCOUNTER — Telehealth: Payer: Self-pay | Admitting: *Deleted

## 2018-12-22 ENCOUNTER — Other Ambulatory Visit: Payer: Self-pay

## 2018-12-22 DIAGNOSIS — Z20822 Contact with and (suspected) exposure to covid-19: Secondary | ICD-10-CM

## 2018-12-22 NOTE — Telephone Encounter (Signed)
Colon Branch, MD  [facility] Contacted pt to schedule COVID testing per Dr Larose Kells, pt accepted appointment at [facility] site 12/22/2018 at 1400; pt given address, location, and instructions that he and all occupants of his vehicle should wear masks; he verbalized understanding; orders placed per protocol.

## 2018-12-22 NOTE — Assessment & Plan Note (Signed)
COVID-19 exposure: As described above, the last contact with somebody potentially COVID-19 positive was approximately 5 days ago. He is already quarantine, recommend to continue to do so. If in 5 additional days he is asymptomatic and his COVID-19 testing came back negative then I will consider him not contagious. If he has minor symptoms, needs to be treated at home, if he has more severe symptoms (high fever, chills, chest pain, shortness of breath) he needs to contact the office right away. We will notify the [facility] community testing site to call the patient.

## 2018-12-24 LAB — NOVEL CORONAVIRUS, NAA: SARS-CoV-2, NAA: NOT DETECTED

## 2019-04-05 ENCOUNTER — Encounter: Payer: Self-pay | Admitting: Internal Medicine

## 2019-04-15 ENCOUNTER — Other Ambulatory Visit: Payer: Self-pay

## 2019-04-16 ENCOUNTER — Ambulatory Visit (INDEPENDENT_AMBULATORY_CARE_PROVIDER_SITE_OTHER): Payer: No Typology Code available for payment source | Admitting: Internal Medicine

## 2019-04-16 ENCOUNTER — Encounter: Payer: Self-pay | Admitting: Internal Medicine

## 2019-04-16 VITALS — BP 111/86 | HR 71 | Temp 97.4°F | Resp 16 | Ht 71.0 in | Wt 187.1 lb

## 2019-04-16 DIAGNOSIS — R002 Palpitations: Secondary | ICD-10-CM

## 2019-04-16 DIAGNOSIS — Z23 Encounter for immunization: Secondary | ICD-10-CM | POA: Diagnosis not present

## 2019-04-16 DIAGNOSIS — Z0000 Encounter for general adult medical examination without abnormal findings: Secondary | ICD-10-CM | POA: Diagnosis not present

## 2019-04-16 NOTE — Patient Instructions (Addendum)
   GO TO THE FRONT DESK Schedule labs for next week, fasting  Schedule a nurse visit 2 or 3 months from today for your next Shingrix  Schedule your next appointment   for a physical exam in 1 year

## 2019-04-16 NOTE — Progress Notes (Signed)
Subjective:    Patient ID: Jeremiah [name], male    DOB: [birth year], 63 y.o.   MRN: [id number]  DOS:  04/16/2019 Type of visit - description: CPX No major concerns   Review of Systems From time to time, he feels a skip heartbeat, lasts few seconds, and is associated with cough.  Never exertional, he is extremely active without any problems. Denies chest pain, difficulty breathing.    Other than above, a 14 point review of systems is negative       Past Medical History:  Diagnosis Date  . Actinic keratitis   . Erythema nodosum    per Bx 06-2010, d/t a virus, saw ID and rheumatology  . Hemorrhoids     Past Surgical History:  Procedure Laterality Date  . REFRACTIVE SURGERY      Social History   Socioeconomic History  . Marital status: Married    Spouse name: Not on file  . Number of children: 2  . Years of education: Not on file  . Highest education level: Not on file  Occupational History  . 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 Financial resource strain: Not on file  . Food insecurity    Worry: Not on file    Inability: Not on file  . Transportation needs    Medical: Not on file    Non-medical: Not on file  Tobacco Use  . Smoking status: Never Smoker  . Smokeless tobacco: Never Used  Substance and Sexual Activity  . Alcohol use: Yes    Comment: socially   . Drug use: No  . Sexual activity: Not on file  Lifestyle  . Physical activity    Days per week: Not on file    Minutes per session: Not on file  . Stress: Not on file  Relationships  . Social Herbalist on phone: Not on file    Gets together: Not on file    Attends religious service: Not on file    Active member of club or organization: Not on file    Attends meetings of clubs or organizations: Not on file    Relationship status: Not on file  . Intimate partner violence    Fear of current or ex partner: Not on file    Emotionally abused: Not on file   Physically abused: Not on file    Forced sexual activity: Not on file  Other Topics Concern  . Not on file  Social History Narrative   Goes by  "[name]"    Very active, outdoors    Household- pt and wife      Family History  Problem Relation Age of Onset  . Heart disease Father        F had a MI age 75  . Hyperlipidemia Mother        F, M  . Transient ischemic attack Mother        age 53  . Dementia Other        F, aunts  . CAD Other        GF, > 34 y/o  . Colon cancer Neg Hx   . Prostate cancer Neg Hx   . Skin cancer Neg Hx      Allergies as of 04/16/2019   No Known Allergies     Medication List    as of April 16, 2019 11:59 PM   You have not been prescribed any medications.  Objective:   Physical Exam BP 111/86 (BP Location: Left Arm, Patient Position: Sitting, Cuff Size: Normal)   Pulse 71   Temp (!) 97.4 F (36.3 C) (Temporal)   Resp 16   Ht 5\' 11"  (1.803 m)   Wt 187 lb 2 oz (84.9 kg)   SpO2 96%   BMI 26.10 kg/m  General: Well developed, NAD, BMI noted Neck: No  thyromegaly  HEENT:  Normocephalic . Face symmetric, atraumatic Lungs:  CTA B Normal respiratory effort, no intercostal retractions, no accessory muscle use. Heart: RRR,  no murmur.  No pretibial edema bilaterally  Abdomen:  Not distended, soft, non-tender. No rebound or rigidity.   Skin: Exposed areas without rash. Not pale. Not jaundice Neurologic:  alert & oriented X3.  Speech normal, gait appropriate for age and unassisted Strength symmetric and appropriate for age.  Psych: Cognition and judgment appear intact.  Cooperative with normal attention span and concentration.  Behavior appropriate. No anxious or depressed appearing.     Assessment      Assessment Erythema nodosum, + biopsy 2012, d/t a virus, saw ID and rheumatology [facility] R leg, 03/2019   PLAN: For CPX Palpitations: No red flag symptoms, EKG today NSR, unchanged from previous Call if symptoms  increase or change Plan: Labs next week, nop fasting today,  Shingrix #2 in 2-3 months, CPX in 1 year

## 2019-04-16 NOTE — Assessment & Plan Note (Signed)
-  Td 12-2015 - shingrix:  #1 today, next in 2 to 3 months -  flu shot   today -CCS: Cscope  2001, negative per patient (done @ [facility]),cscope again in 2012: no polyps, next cscope 5 to 10 years ; cost is an issue, will again try to get a IFOB  -prostate cancer screening: DRE PSA 2019: wnl  -Labs: Will come back fasting for CMP CBC FLP TSH -Cont healthy life style

## 2019-04-16 NOTE — Progress Notes (Signed)
Pre visit review using our clinic review tool, if applicable. No additional management support is needed unless otherwise documented below in the visit note. 

## 2019-04-18 NOTE — Assessment & Plan Note (Signed)
For CPX Palpitations: No red flag symptoms, EKG today NSR, unchanged from previous Call if symptoms increase or change Plan: Labs next week, nop fasting today,  Shingrix #2 in 2-3 months, CPX in 1 year

## 2019-04-19 ENCOUNTER — Other Ambulatory Visit: Payer: Self-pay

## 2019-04-19 ENCOUNTER — Other Ambulatory Visit (INDEPENDENT_AMBULATORY_CARE_PROVIDER_SITE_OTHER): Payer: No Typology Code available for payment source

## 2019-04-19 DIAGNOSIS — Z0000 Encounter for general adult medical examination without abnormal findings: Secondary | ICD-10-CM

## 2019-04-19 DIAGNOSIS — C4491 Basal cell carcinoma of skin, unspecified: Secondary | ICD-10-CM | POA: Insufficient documentation

## 2019-04-19 LAB — CBC WITH DIFFERENTIAL/PLATELET
Basophils Absolute: 0 10*3/uL (ref 0.0–0.1)
Basophils Relative: 0.4 % (ref 0.0–3.0)
Eosinophils Absolute: 0.9 10*3/uL — ABNORMAL HIGH (ref 0.0–0.7)
Eosinophils Relative: 11.6 % — ABNORMAL HIGH (ref 0.0–5.0)
HCT: 44.8 % (ref 39.0–52.0)
Hemoglobin: 15.1 g/dL (ref 13.0–17.0)
Lymphocytes Relative: 19.4 % (ref 12.0–46.0)
Lymphs Abs: 1.4 10*3/uL (ref 0.7–4.0)
MCHC: 33.8 g/dL (ref 30.0–36.0)
MCV: 91.4 fl (ref 78.0–100.0)
Monocytes Absolute: 1 10*3/uL (ref 0.1–1.0)
Monocytes Relative: 13.7 % — ABNORMAL HIGH (ref 3.0–12.0)
Neutro Abs: 4 10*3/uL (ref 1.4–7.7)
Neutrophils Relative %: 54.9 % (ref 43.0–77.0)
Platelets: 244 10*3/uL (ref 150.0–400.0)
RBC: 4.9 Mil/uL (ref 4.22–5.81)
RDW: 12.9 % (ref 11.5–15.5)
WBC: 7.3 10*3/uL (ref 4.0–10.5)

## 2019-04-19 LAB — COMPREHENSIVE METABOLIC PANEL
ALT: 19 U/L (ref 0–53)
AST: 24 U/L (ref 0–37)
Albumin: 4.6 g/dL (ref 3.5–5.2)
Alkaline Phosphatase: 77 U/L (ref 39–117)
BUN: 11 mg/dL (ref 6–23)
CO2: 32 mEq/L (ref 19–32)
Calcium: 9.5 mg/dL (ref 8.4–10.5)
Chloride: 99 mEq/L (ref 96–112)
Creatinine, Ser: 1.1 mg/dL (ref 0.40–1.50)
GFR: 67.75 mL/min (ref >60.00)
Glucose, Bld: 94 mg/dL (ref 70–99)
Potassium: 4.4 mEq/L (ref 3.5–5.1)
Sodium: 138 mEq/L (ref 135–145)
Total Bilirubin: 0.6 mg/dL (ref 0.2–1.2)
Total Protein: 7.2 g/dL (ref 6.0–8.3)

## 2019-04-19 LAB — LIPID PANEL
Cholesterol: 195 mg/dL (ref 0–200)
HDL: 42.5 mg/dL (ref >39.00)
LDL Cholesterol: 125 mg/dL — ABNORMAL HIGH (ref 0–99)
NonHDL: 152.99
Total CHOL/HDL Ratio: 5
Triglycerides: 142 mg/dL (ref 0.0–149.0)
VLDL: 28.4 mg/dL (ref 0.0–40.0)

## 2019-04-19 LAB — TSH: TSH: 2.42 u[IU]/mL (ref 0.35–4.50)

## 2019-07-08 ENCOUNTER — Ambulatory Visit (INDEPENDENT_AMBULATORY_CARE_PROVIDER_SITE_OTHER): Payer: No Typology Code available for payment source

## 2019-07-08 ENCOUNTER — Other Ambulatory Visit: Payer: Self-pay

## 2019-07-08 DIAGNOSIS — Z23 Encounter for immunization: Secondary | ICD-10-CM | POA: Diagnosis not present

## 2019-08-16 ENCOUNTER — Ambulatory Visit (INDEPENDENT_AMBULATORY_CARE_PROVIDER_SITE_OTHER): Payer: No Typology Code available for payment source | Admitting: Internal Medicine

## 2019-08-16 ENCOUNTER — Other Ambulatory Visit: Payer: Self-pay

## 2019-08-16 ENCOUNTER — Encounter: Payer: Self-pay | Admitting: Internal Medicine

## 2019-08-16 VITALS — BP 132/85 | HR 77 | Temp 95.6°F | Resp 16 | Ht 71.0 in | Wt 193.1 lb

## 2019-08-16 DIAGNOSIS — R002 Palpitations: Secondary | ICD-10-CM | POA: Diagnosis not present

## 2019-08-16 NOTE — Progress Notes (Signed)
Pre visit review using our clinic review tool, if applicable. No additional management support is needed unless otherwise documented below in the visit note. 

## 2019-08-16 NOTE — Patient Instructions (Addendum)
GO TO THE LAB : Get the blood work    Next visit in 3 months, please reschedule  We will schedule echocardiogram and either a Holter monitor or similar device.  Call if symptoms persist  ER if severe symptoms or they are associated with nausea, chest pain, difficulty breathing.

## 2019-08-16 NOTE — Progress Notes (Signed)
   Subjective:    Patient ID: Jeremiah [name], male    DOB: 01-Dec-1955, 64 y.o.   MRN: [id number]  DOS:  08/16/2019 Type of visit - description: Acute  Last month, for approximately 4 weeks, he had palpitations described as his heart feeling "out of rhythm" and going fast. They happen exclusively at night when he was lying down. No associated shortness of breath, nausea, sweats, chest pain.  Episodes last 20 minutes.  Last episode happened 3 days ago.  Also, he occasionally feels flushed. Has been checking his BPs, one time it was 151/92 but on average they are around 130/70.   Review of Systems Denies any particular distress. No nausea, vomiting, diarrhea.  No blood in the stools No headache or dizziness He continue exercising, today he swim 1,100 yards without any problems  Past Medical History:  Diagnosis Date  . Actinic keratitis   . BCC (basal cell carcinoma of skin)   . Erythema nodosum    per Bx 06-2010, d/t a virus, saw ID and rheumatology  . Hemorrhoids     Past Surgical History:  Procedure Laterality Date  . REFRACTIVE SURGERY      Allergies as of 08/16/2019   No Known Allergies     Medication List    as of August 16, 2019  2:35 PM   You have not been prescribed any medications.      Objective:   Physical Exam BP 132/85 (BP Location: Left Arm, Patient Position: Sitting, Cuff Size: Normal)   Pulse 77   Temp (!) 95.6 F (35.3 C) (Temporal)   Resp 16   Ht 5\' 11"  (1.803 m)   Wt 193 lb 2 oz (87.6 kg)   SpO2 100%   BMI 26.94 kg/m  General:   Well developed, NAD, BMI noted.  HEENT:  Normocephalic . Face symmetric, atraumatic Neck: No thyromegaly Lungs:  CTA B Normal respiratory effort, no intercostal retractions, no accessory muscle use. Heart: RRR,  no murmur.  Abdomen:  Not distended, soft, non-tender. No rebound or rigidity.   Skin: Not pale. Not jaundice Lower extremities: no pretibial edema bilaterally  Neurologic:  alert & oriented  X3.  Speech normal, gait appropriate for age and unassisted Psych--  Cognition and judgment appear intact.  Cooperative with normal attention span and concentration.  Behavior appropriate. No anxious or depressed appearing.     Assessment      Assessment Erythema nodosum, + biopsy 2012, d/t a virus, saw ID and rheumatology [facility] R leg, 03/2019 FH CAD F (MI age 20)  PLAN: Palpitations: As described above, for completeness we will recheck a BMP, CBC, TSH EKG: NSR. Echo Holter versus Zio patch?  Symptoms are infrequent, Zio patch more likely to capture a abnormality, that requires a cardiology referral.  Will arrange. Call if symptoms increase, ER if severe. BP elevated: Very mild elevation, observation, RTC 3 months     This visit occurred during the SARS-CoV-2 public health emergency.  Safety protocols were in place, including screening questions prior to the visit, additional usage of staff PPE, and extensive cleaning of exam room while observing appropriate contact time as indicated for disinfecting solutions.

## 2019-08-17 LAB — BASIC METABOLIC PANEL
BUN: 17 mg/dL (ref 6–23)
CO2: 29 mEq/L (ref 19–32)
Calcium: 9.3 mg/dL (ref 8.4–10.5)
Chloride: 103 mEq/L (ref 96–112)
Creatinine, Ser: 0.98 mg/dL (ref 0.40–1.50)
GFR: 77.33 mL/min (ref >60.00)
Glucose, Bld: 86 mg/dL (ref 70–99)
Potassium: 4.4 mEq/L (ref 3.5–5.1)
Sodium: 139 mEq/L (ref 135–145)

## 2019-08-17 LAB — CBC WITH DIFFERENTIAL/PLATELET
Basophils Absolute: 0 10*3/uL (ref 0.0–0.1)
Basophils Relative: 0.6 % (ref 0.0–3.0)
Eosinophils Absolute: 0.4 10*3/uL (ref 0.0–0.7)
Eosinophils Relative: 4.9 % (ref 0.0–5.0)
HCT: 43.3 % (ref 39.0–52.0)
Hemoglobin: 14.4 g/dL (ref 13.0–17.0)
Lymphocytes Relative: 22.4 % (ref 12.0–46.0)
Lymphs Abs: 1.7 10*3/uL (ref 0.7–4.0)
MCHC: 33.3 g/dL (ref 30.0–36.0)
MCV: 90.2 fl (ref 78.0–100.0)
Monocytes Absolute: 0.7 10*3/uL (ref 0.1–1.0)
Monocytes Relative: 8.8 % (ref 3.0–12.0)
Neutro Abs: 4.7 10*3/uL (ref 1.4–7.7)
Neutrophils Relative %: 63.3 % (ref 43.0–77.0)
Platelets: 271 10*3/uL (ref 150.0–400.0)
RBC: 4.8 Mil/uL (ref 4.22–5.81)
RDW: 13 % (ref 11.5–15.5)
WBC: 7.5 10*3/uL (ref 4.0–10.5)

## 2019-08-17 LAB — TSH: TSH: 1.47 u[IU]/mL (ref 0.35–4.50)

## 2019-08-20 NOTE — Assessment & Plan Note (Signed)
Palpitations: As described above, for completeness we will recheck a BMP, CBC, TSH EKG: NSR. Echo Holter versus Zio patch?  Symptoms are infrequent, Zio patch more likely to capture a abnormality, that requires a cardiology referral.  Will arrange. Call if symptoms increase, ER if severe. BP elevated: Very mild elevation, observation, RTC 3 months

## 2019-08-30 ENCOUNTER — Other Ambulatory Visit (HOSPITAL_COMMUNITY): Payer: No Typology Code available for payment source

## 2019-08-30 ENCOUNTER — Other Ambulatory Visit: Payer: Self-pay

## 2019-08-30 ENCOUNTER — Encounter (HOSPITAL_COMMUNITY): Payer: Self-pay

## 2019-08-30 DIAGNOSIS — R079 Chest pain, unspecified: Secondary | ICD-10-CM

## 2019-08-30 NOTE — Progress Notes (Signed)
Cardiac Scoring order placed at verbal request of Dr Caryl Comes.

## 2019-08-31 ENCOUNTER — Ambulatory Visit (INDEPENDENT_AMBULATORY_CARE_PROVIDER_SITE_OTHER)
Admission: RE | Admit: 2019-08-31 | Discharge: 2019-08-31 | Disposition: A | Discharge status: Home / Self Care | Payer: Self-pay | Source: Ambulatory Visit | Attending: Internal Medicine | Admitting: Internal Medicine

## 2019-08-31 ENCOUNTER — Other Ambulatory Visit: Payer: Self-pay

## 2019-08-31 DIAGNOSIS — R079 Chest pain, unspecified: Secondary | ICD-10-CM

## 2019-09-01 ENCOUNTER — Ambulatory Visit (INDEPENDENT_AMBULATORY_CARE_PROVIDER_SITE_OTHER): Payer: No Typology Code available for payment source | Admitting: Cardiology

## 2019-09-01 VITALS — BP 110/79 | HR 64 | Temp 97.0°F | Ht 71.0 in | Wt 191.2 lb

## 2019-09-01 DIAGNOSIS — I2510 Atherosclerotic heart disease of native coronary artery without angina pectoris: Secondary | ICD-10-CM | POA: Diagnosis not present

## 2019-09-01 DIAGNOSIS — R002 Palpitations: Secondary | ICD-10-CM | POA: Diagnosis not present

## 2019-09-01 DIAGNOSIS — E785 Hyperlipidemia, unspecified: Secondary | ICD-10-CM | POA: Diagnosis not present

## 2019-09-01 MED ORDER — ATORVASTATIN CALCIUM 40 MG PO TABS: 40.0000 mg | ORAL_TABLET | Freq: Every day | ORAL | 3 refills | Status: DC

## 2019-09-01 NOTE — Progress Notes (Signed)
Cardiology Office Note:    Date:  09/01/2019   ID:  Jeremiah [name], DOB [birth year], MRN [id number]  PCP:  Colon Branch, MD  Cardiologist:  No primary care provider on file.  Electrophysiologist:  None   Referring MD: Colon Branch, MD   Chief Complaint  Patient presents with  . Palpitations    History of Present Illness:    Jeremiah [name] is a 64 y.o. male with a hx of basal cell carcinoma, erythema nodosum who is referred by Dr. Larose Kells for evaluation of palpitations.  He reports that he has been having palpitations for years, was occurring infrequently.  Starting in December/January, he started having more frequent palpitations.  States that during episodes his heart rhythm feels irregular.  Does not feel like heart is racing.  Typically lasts for around 15 minutes.  Denies any shortness of breath, chest pain, lightheadedness, or syncope.  Reports he has not had any palpitations since January.  He is very active, reports he swims, bikes, and walks regularly.  He denies any exertional symptoms.  He has been checking his blood pressure regularly, recently has been 110s to 140s over 70s to 90s.  He had a calcium score done on 08/31/2019, which showed calcium score 210 (75th percentile).  No smoking history.  Father had an MI in his 5s.    Past Medical History:  Diagnosis Date  . Actinic keratitis   . BCC (basal cell carcinoma of skin)   . Erythema nodosum    per Bx 06-2010, d/t a virus, saw ID and rheumatology  . Hemorrhoids     Past Surgical History:  Procedure Laterality Date  . REFRACTIVE SURGERY      Current Medications: No outpatient medications have been marked as taking for the 09/01/19 encounter (Office Visit) with Donato Heinz, MD.     Allergies:   Patient has no known allergies.   Social History   Socioeconomic History  . Marital status: Married    Spouse name: Not on file  . Number of children: 2  . Years of education: Not on file  . Highest  education level: Not on file  Occupational History  . 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 Smoking status: Never Smoker  . Smokeless tobacco: Never Used  Substance and Sexual Activity  . Alcohol use: Yes    Comment: socially   . Drug use: No  . Sexual activity: Not on file  Other Topics Concern  . Not on file  Social History Narrative   Goes by  "[name]"    Very active, outdoors    Household- pt and wife    Social Determinants of Radio broadcast assistant Strain:   . Difficulty of Paying Living Expenses: Not on file  Food Insecurity:   . Worried About Charity fundraiser in the Last Year: Not on file  . Ran Out of Food in the Last Year: Not on file  Transportation Needs:   . Lack of Transportation (Medical): Not on file  . Lack of Transportation (Non-Medical): Not on file  Physical Activity:   . Days of Exercise per Week: Not on file  . Minutes of Exercise per Session: Not on file  Stress:   . Feeling of Stress : Not on file  Social Connections:   . Frequency of Communication with Friends and Family: Not on file  . Frequency of Social Gatherings with Friends and Family: Not on file  .  Attends Religious Services: Not on file  . Active Member of Clubs or Organizations: Not on file  . Attends Archivist Meetings: Not on file  . Marital Status: Not on file     Family History: The patient's family history includes CAD in an other family member; Dementia in an other family member; Heart disease in his father; Hyperlipidemia in his mother; Transient ischemic attack in his mother. There is no history of Colon cancer, Prostate cancer, or Skin cancer.  ROS:   Please see the history of present illness.     All other systems reviewed and are negative.  EKGs/Labs/Other Studies Reviewed:    The following studies were reviewed today:   EKG:  EKG is  ordered today.  The ekg ordered today demonstrates normal sinus rhythm, rate 64, less than 1  mm ST depression in lead III  Recent Labs: 04/19/2019: ALT 19 08/16/2019: BUN 17; Creatinine, Ser 0.98; Hemoglobin 14.4; Platelets 271.0; Potassium 4.4; Sodium 139; TSH 1.47  Recent Lipid Panel    Component Value Date/Time   CHOL 195 04/19/2019 0720   TRIG 142.0 04/19/2019 0720   HDL 42.50 04/19/2019 0720   CHOLHDL 5 04/19/2019 0720   VLDL 28.4 04/19/2019 0720   LDLCALC 125 (H) 04/19/2019 0720    Physical Exam:    VS:  BP 110/79   Pulse 64   Temp (!) 97 F (36.1 C)   Ht 5\' 11"  (1.803 m)   Wt 191 lb 3.2 oz (86.7 kg)   SpO2 99%   BMI 26.67 kg/m     Wt Readings from Last 3 Encounters:  09/01/19 191 lb 3.2 oz (86.7 kg)  08/16/19 193 lb 2 oz (87.6 kg)  04/16/19 187 lb 2 oz (84.9 kg)     [id number] nourished, well developed in no acute distress HEENT: Normal NECK: No JVD; No carotid bruits LYMPHATICS: No lymphadenopathy CARDIAC: RRR, no murmurs, rubs, gallops RESPIRATORY:  Clear to auscultation without rales, wheezing or rhonchi  ABDOMEN: Soft, non-tender, non-distended MUSCULOSKELETAL:  No edema; No deformity  SKIN: Warm and dry NEUROLOGIC:  Alert and oriented x 3 PSYCHIATRIC:  Normal affect   ASSESSMENT:    1. Coronary artery disease involving native coronary artery of native heart without angina pectoris   2. Palpitations   3. Hyperlipidemia, unspecified hyperlipidemia type    PLAN:    In order of problems listed above:  CAD: Calcium score 210, 75th percentile for age/gender.  No anginal symptoms to suggest obstructive coronary disease.  LDL 125 on 04/19/2019.  Will start on atorvastatin 40 mg, for goal LDL less than 70.  Palpitations: Description concerning for arrhythmia.  No recent episodes, last episode was in January.  He recently started using Kardia to monitor for arrhythmia.  Given infrequency of episodes, will hold off on monitor at this time and recommended that he continue to use Kardia to capture an episode of palpitations  Hyperlipidemia: LDL 125,  starting atorvastatin as above  RTC in 3 months   Medication Adjustments/Labs and Tests Ordered: Current medicines are reviewed at length with the patient today.  Concerns regarding medicines are outlined above.  Orders Placed This Encounter  Procedures  . EKG 12-Lead   Meds ordered this encounter  Medications  . atorvastatin (LIPITOR) 40 MG tablet    Sig: Take 1 tablet (40 mg total) by mouth daily.    Dispense:  90 tablet    Refill:  3    Patient Instructions  Medication Instructions:  START atorvastatin (  Lipitor) 40 mg daily  *If you need a refill on your cardiac medications before your next appointment, please call your pharmacy*  Lab Work: NONE  Testing/Procedures: NONE  Follow-Up: At [facility], you and your health needs are our priority.  As part of our continuing mission to provide you with exceptional heart care, we have created designated Provider Care Teams.  These Care Teams include your primary Cardiologist (physician) and Advanced Practice Providers (APPs -  Physician Assistants and Nurse Practitioners) who all work together to provide you with the care you need, when you need it.  We recommend signing up for the patient portal called "MyChart".  Sign up information is provided on this After Visit Summary.  MyChart is used to connect with patients for Virtual Visits (Telemedicine).  Patients are able to view lab/test results, encounter notes, upcoming appointments, etc.  Non-urgent messages can be sent to your provider as well.   To learn more about what you can do with MyChart, go to [email or web address].    Your next appointment:   3 month(s)  The format for your next appointment:   Either In Person or Virtual  Provider:   Oswaldo Milian, MD       Signed, Donato Heinz, MD  09/01/2019 6:11 PM    [place]

## 2019-09-01 NOTE — Patient Instructions (Signed)
Medication Instructions:  START atorvastatin (Lipitor) 40 mg daily  *If you need a refill on your cardiac medications before your next appointment, please call your pharmacy*  Lab Work: NONE  Testing/Procedures: NONE  Follow-Up: At [facility], you and your health needs are our priority.  As part of our continuing mission to provide you with exceptional heart care, we have created designated Provider Care Teams.  These Care Teams include your primary Cardiologist (physician) and Advanced Practice Providers (APPs -  Physician Assistants and Nurse Practitioners) who all work together to provide you with the care you need, when you need it.  We recommend signing up for the patient portal called "MyChart".  Sign up information is provided on this After Visit Summary.  MyChart is used to connect with patients for Virtual Visits (Telemedicine).  Patients are able to view lab/test results, encounter notes, upcoming appointments, etc.  Non-urgent messages can be sent to your provider as well.   To learn more about what you can do with MyChart, go to [email or web address].    Your next appointment:   3 month(s)  The format for your next appointment:   Either In Person or Virtual  Provider:   Oswaldo Milian, MD

## 2019-11-15 ENCOUNTER — Ambulatory Visit: Payer: No Typology Code available for payment source | Admitting: Internal Medicine

## 2019-12-02 NOTE — Progress Notes (Signed)
Cardiology Office Note:    Date:  12/03/2019   ID:  Jeremiah [name], DOB 02-26-1956, MRN [id number]  PCP:  Jeremiah Branch, MD  Cardiologist:  No primary care provider on file.  Electrophysiologist:  None   Referring MD: Jeremiah Branch, MD   No chief complaint on file.   History of Present Illness:    Jeremiah [name] is a 64 y.o. male with a hx of basal cell carcinoma, erythema nodosum who presents for follow-up.  He was referred by Dr. Larose [name] for evaluation of palpitations, initially seen on 09/01/2019.  He reports that he has been having palpitations for years, was occurring infrequently.  Starting in December 2020, he started having more frequent palpitations.  States that during episodes his heart rhythm feels irregular.  Does not feel like heart is racing.  Typically lasts for around 15 minutes.  Denies any shortness of breath, chest pain, lightheadedness, or syncope.  Reports he has not had any palpitations since January 2021.  He is very active, reports he swims, bikes, and walks regularly.  He denies any exertional symptoms.  He has been checking his blood pressure regularly, recently has been 110s to 140s over 70s to 90s.  He had a calcium score done on 08/31/2019, which showed calcium score 210 (75th percentile).  No smoking history.  Father had an MI in his 63s.  Since last clinic visit, he reports he has been doing well.  He has had one episode of palpitations in the last 3 months, which he caught on [place] monitor.  [place] monitor strip shows sinus rhythm with PACs.  Lasted 5 minutes.  He continues to exercise 4 times per week, including swimming and biking.  He denies any chest pain, dyspnea, lightheadedness, syncope, or lower extremity edema.   Past Medical History:  Diagnosis Date  . Actinic keratitis   . BCC (basal cell carcinoma of skin)   . Erythema nodosum    per Bx 06-2010, d/t a virus, saw ID and rheumatology  . Hemorrhoids     Past Surgical History:  Procedure Laterality  Date  . REFRACTIVE SURGERY      Current Medications: No outpatient medications have been marked as taking for the 12/03/19 encounter (Office Visit) with Jeremiah Heinz, MD.     Allergies:   Patient has no known allergies.   Social History   Socioeconomic History  . Marital status: Married    Spouse name: Not on file  . Number of children: 2  . Years of education: Not on file  . Highest education level: Not on file  Occupational History  . 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 Smoking status: Never Smoker  . Smokeless tobacco: Never Used  Substance and Sexual Activity  . Alcohol use: Yes    Comment: socially   . Drug use: No  . Sexual activity: Not on file  Other Topics Concern  . Not on file  Social History Narrative   Goes by  "Jeremiah [name]"    Very active, outdoors    Household- pt and wife    Social Determinants of Radio broadcast assistant Strain:   . Difficulty of Paying Living Expenses:   Food Insecurity:   . Worried About Charity fundraiser in the Last Year:   . Arboriculturist in the Last Year:   Transportation Needs:   . Film/video editor (Medical):   Marland Kitchen Lack of Transportation (Non-Medical):  Physical Activity:   . Days of Exercise per Week:   . Minutes of Exercise per Session:   Stress:   . Feeling of Stress :   Social Connections:   . Frequency of Communication with Friends and Family:   . Frequency of Social Gatherings with Friends and Family:   . Attends Religious Services:   . Active Member of Clubs or Organizations:   . Attends Archivist Meetings:   Marland Kitchen Marital Status:      Family History: The patient's family history includes CAD in an other family member; Dementia in an other family member; Heart disease in his father; Hyperlipidemia in his mother; Transient ischemic attack in his mother. There is no history of Jeremiah cancer, Prostate cancer, or Skin cancer.  ROS:   Please see the history of  present illness.     All other systems reviewed and are negative.  EKGs/Labs/Other Studies Reviewed:    The following studies were reviewed today:   EKG:  EKG is not ordered today.  The ekg ordered most recently demonstrates normal sinus rhythm, rate 64, less than 1 mm ST depression in lead III  Recent Labs: 04/19/2019: ALT 19 08/16/2019: BUN 17; Creatinine, Ser 0.98; Hemoglobin 14.4; Platelets 271.0; Potassium 4.4; Sodium 139; TSH 1.47  Recent Lipid Panel    Component Value Date/Time   CHOL 195 04/19/2019 0720   TRIG 142.0 04/19/2019 0720   HDL 42.50 04/19/2019 0720   CHOLHDL 5 04/19/2019 0720   VLDL 28.4 04/19/2019 0720   LDLCALC 125 (H) 04/19/2019 0720    Physical Exam:    VS:  BP 113/75   Pulse 60   Ht 5\' 11"  (1.803 m)   Wt 188 lb 6.4 oz (85.5 kg)   SpO2 97%   BMI 26.28 kg/m     Wt Readings from Last 3 Encounters:  12/03/19 188 lb 6.4 oz (85.5 kg)  09/01/19 191 lb 3.2 oz (86.7 kg)  08/16/19 193 lb 2 oz (87.6 kg)     WFU:XNAT nourished, well developed in no acute distress HEENT: Normal NECK: No JVD; No carotid bruits LYMPHATICS: No lymphadenopathy CARDIAC: RRR, no murmurs, rubs, gallops RESPIRATORY:  Clear to auscultation without rales, wheezing or rhonchi  ABDOMEN: Soft, non-tender, non-distended MUSCULOSKELETAL:  No edema; No deformity  SKIN: Warm and dry NEUROLOGIC:  Alert and oriented x 3 PSYCHIATRIC:  Normal affect   ASSESSMENT:    1. Hyperlipidemia, unspecified hyperlipidemia type   2. Coronary artery disease involving native coronary artery of native heart without angina pectoris   3. Palpitations    PLAN:     CAD: Calcium score 210, 75th percentile for age/gender.  No anginal symptoms to suggest obstructive coronary disease.  LDL 125 on 04/19/2019.  Started on atorvastatin 40 mg in March 2021, for goal LDL less than 70.  Will recheck lipid panel  Palpitations: Cardiac monitor shows sinus rhythm with PACs during episode of palpitations.  Has had  one episode of this since last 3 months.  Given infrequent episodes, no treatment indicated at this time  Hyperlipidemia: LDL 125, on atorvastatin 40 mg daily as above.  Will recheck lipid panel  RTC in 6 months   Medication Adjustments/Labs and Tests Ordered: Current medicines are reviewed at length with the patient today.  Concerns regarding medicines are outlined above.  Orders Placed This Encounter  Procedures  . Lipid panel   No orders of the defined types were placed in this encounter.   Patient Instructions  Medication Instructions:  Your physician recommends that you continue on your current medications as directed. Please refer to the Current Medication list given to you today.  *If you need a refill on your cardiac medications before your next appointment, please call your pharmacy*  Lab Work: Lipid panel  If you have labs (blood work) drawn today and your tests are completely normal, you will receive your results only by: Marland Kitchen MyChart Message (if you have MyChart) OR . A paper copy in the mail If you have any lab test that is abnormal or we need to change your treatment, we will call you to review the results.  Follow-Up: At [facility], you and your health needs are our priority.  As part of our continuing mission to provide you with exceptional heart care, we have created designated Provider Care Teams.  These Care Teams include your primary Cardiologist (physician) and Advanced Practice Providers (APPs -  Physician Assistants and Nurse Practitioners) who all work together to provide you with the care you need, when you need it.  We recommend signing up for the patient portal called "MyChart".  Sign up information is provided on this After Visit Summary.  MyChart is used to connect with patients for Virtual Visits (Telemedicine).  Patients are able to view lab/test results, encounter notes, upcoming appointments, etc.  Non-urgent messages can be sent to your provider as  well.   To learn more about what you can do with MyChart, go to [email or web address].    Your next appointment:   6 month(s)  The format for your next appointment:   In Person  Provider:   Oswaldo Milian, MD      Signed, Jeremiah Heinz, MD  12/03/2019 9:39 AM    [place]

## 2019-12-03 ENCOUNTER — Ambulatory Visit (INDEPENDENT_AMBULATORY_CARE_PROVIDER_SITE_OTHER): Payer: No Typology Code available for payment source | Admitting: Cardiology

## 2019-12-03 ENCOUNTER — Other Ambulatory Visit: Payer: Self-pay

## 2019-12-03 ENCOUNTER — Encounter: Payer: Self-pay | Admitting: Cardiology

## 2019-12-03 VITALS — BP 113/75 | HR 60 | Ht 71.0 in | Wt 188.4 lb

## 2019-12-03 DIAGNOSIS — R002 Palpitations: Secondary | ICD-10-CM

## 2019-12-03 DIAGNOSIS — E785 Hyperlipidemia, unspecified: Secondary | ICD-10-CM | POA: Diagnosis not present

## 2019-12-03 DIAGNOSIS — I2510 Atherosclerotic heart disease of native coronary artery without angina pectoris: Secondary | ICD-10-CM | POA: Diagnosis not present

## 2019-12-03 LAB — LIPID PANEL
Chol/HDL Ratio: 2.6 ratio (ref 0.0–5.0)
Cholesterol, Total: 95 mg/dL — ABNORMAL LOW (ref 100–199)
HDL: 36 mg/dL — ABNORMAL LOW (ref >39)
LDL Chol Calc (NIH): 43 mg/dL (ref 0–99)
Triglycerides: 74 mg/dL (ref 0–149)
VLDL Cholesterol Cal: 16 mg/dL (ref 5–40)

## 2019-12-03 NOTE — Patient Instructions (Signed)
Medication Instructions:  Your physician recommends that you continue on your current medications as directed. Please refer to the Current Medication list given to you today.  *If you need a refill on your cardiac medications before your next appointment, please call your pharmacy*  Lab Work: Lipid panel  If you have labs (blood work) drawn today and your tests are completely normal, you will receive your results only by: Marland Kitchen MyChart Message (if you have MyChart) OR . A paper copy in the mail If you have any lab test that is abnormal or we need to change your treatment, we will call you to review the results.  Follow-Up: At [facility], you and your health needs are our priority.  As part of our continuing mission to provide you with exceptional heart care, we have created designated Provider Care Teams.  These Care Teams include your primary Cardiologist (physician) and Advanced Practice Providers (APPs -  Physician Assistants and Nurse Practitioners) who all work together to provide you with the care you need, when you need it.  We recommend signing up for the patient portal called "MyChart".  Sign up information is provided on this After Visit Summary.  MyChart is used to connect with patients for Virtual Visits (Telemedicine).  Patients are able to view lab/test results, encounter notes, upcoming appointments, etc.  Non-urgent messages can be sent to your provider as well.   To learn more about what you can do with MyChart, go to [email or web address].    Your next appointment:   6 month(s)  The format for your next appointment:   In Person  Provider:   Oswaldo Milian, MD

## 2020-01-03 ENCOUNTER — Encounter: Payer: Self-pay | Admitting: Internal Medicine

## 2020-05-23 IMAGING — CT CT CARDIAC CORONARY ARTERY CALCIUM SCORE
3 series · 14 of 20 positions shown, 15 images · non-contrast
Comparison: Chest CT 07/20/2010.
COMPARISON: Chest CT 07/20/2010.

Addendum:
EXAM:
OVER-READ INTERPRETATION  CT CHEST

The following report is an over-read performed by radiologist Dr.
Tholentz Pubnasyonal [REDACTED] on 08/31/2019. This
over-read does not include interpretation of cardiac or coronary
anatomy or pathology. The coronary calcium score interpretation by
the cardiologist is attached.
CLINICAL DATA: Risk stratification
Coronary Calcium Score
TECHNIQUE: The patient was scanned on a Siemens Somatom 64 slice scanner. Axial
non-contrast 3 mm slices were carried out through the heart. The
data set was analyzed on a dedicated work station and scored using
the Agatson method.

[Series 2: casc 3.0 bv41 2 bestdiast 68 % · axial · 0.34mm/px · z∈[-237,-153]mm · 4 of 48 slices shown, 5 images]
[im 10/48  vessel]
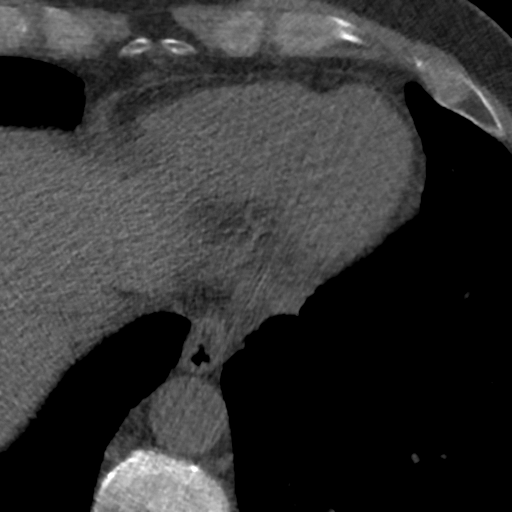
[im 10/48  lung]
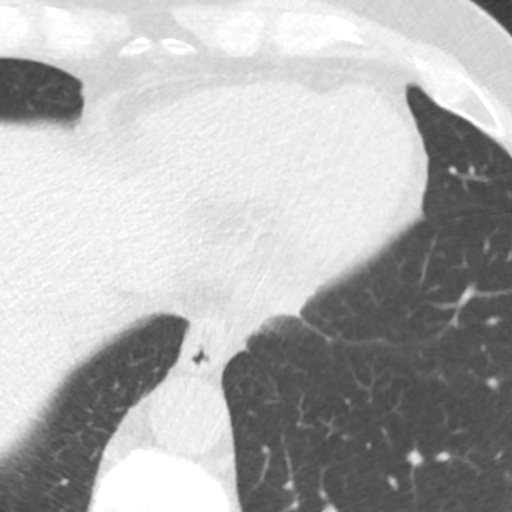
[im 19/48  vessel]
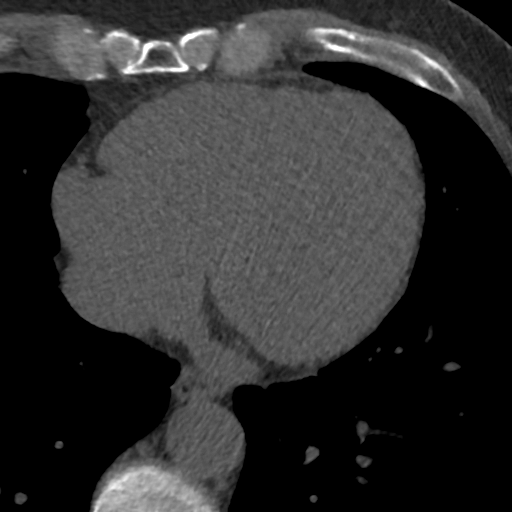
[im 29/48  vessel]
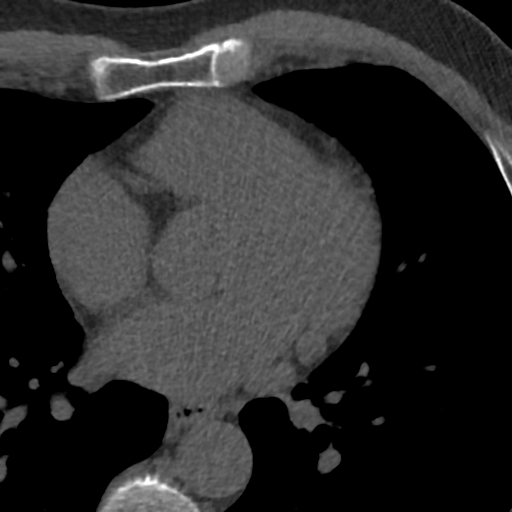
[im 38/48  vessel]
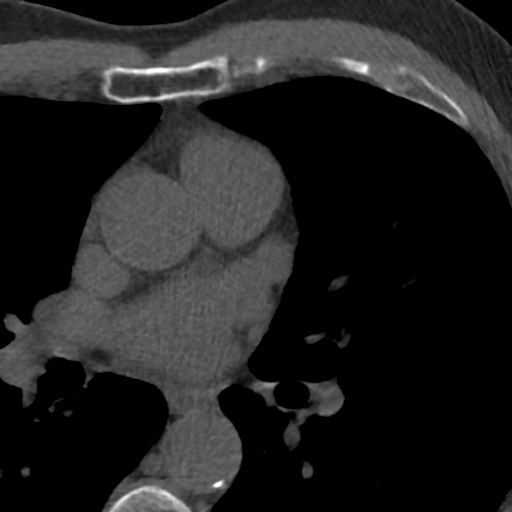

[Series 3: lung 65 % · axial · 0.70mm/px · z∈[-243,-147]mm · 5 of 48 slices shown]
[im 8/48  lung]
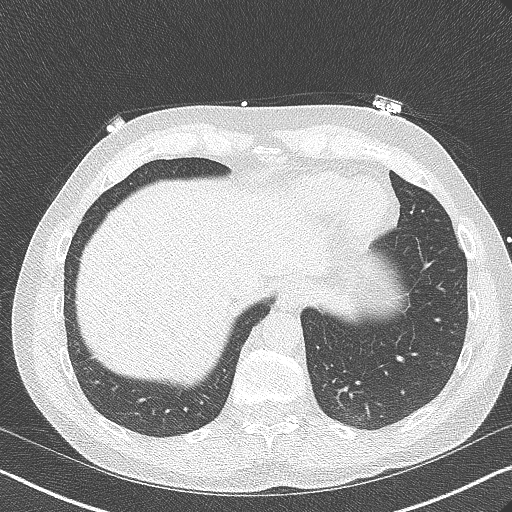
[im 16/48  lung]
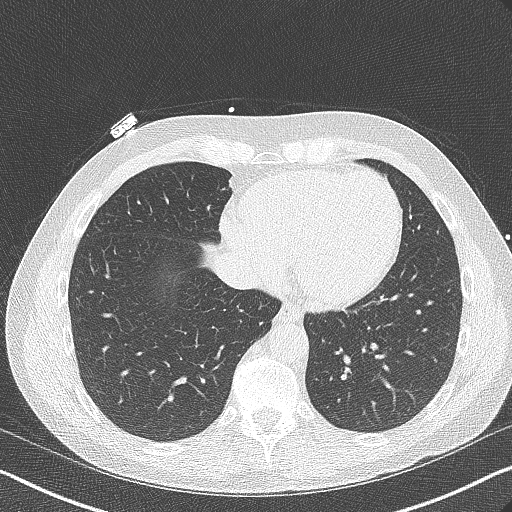
[im 24/48  lung]
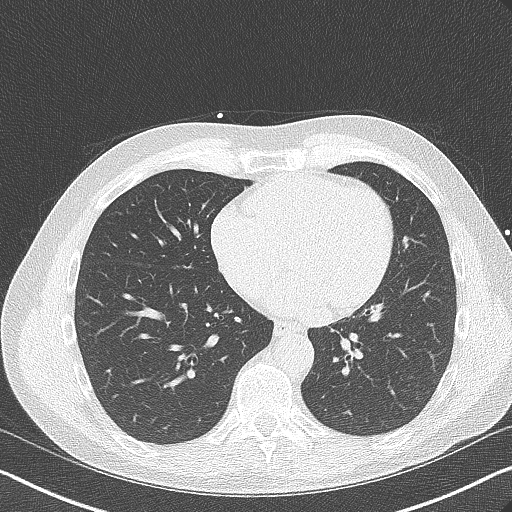
[im 32/48  lung]
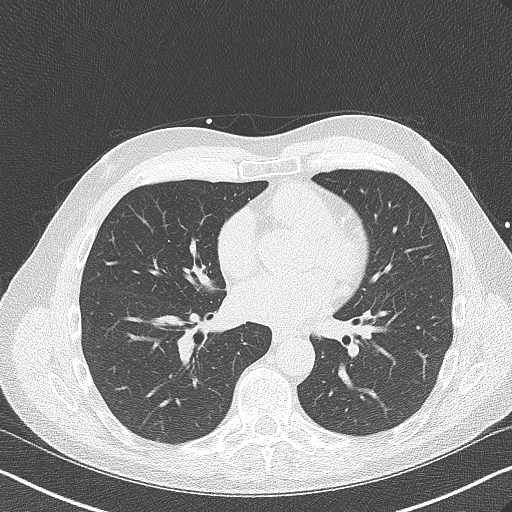
[im 40/48  lung]
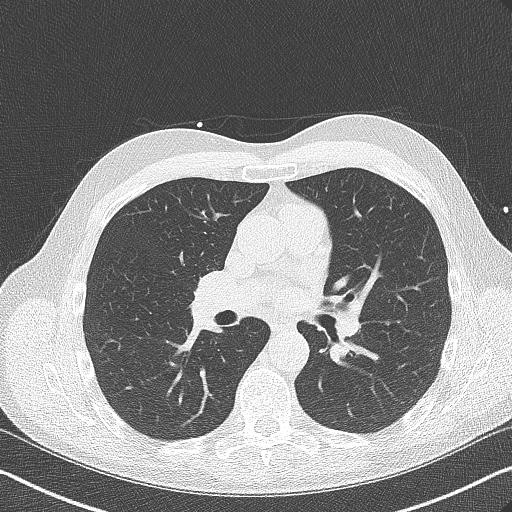

[Series 4: lung st 65 % · axial · 0.70mm/px · z∈[-243,-147]mm · 5 of 48 slices shown]
[im 8/48  lung]
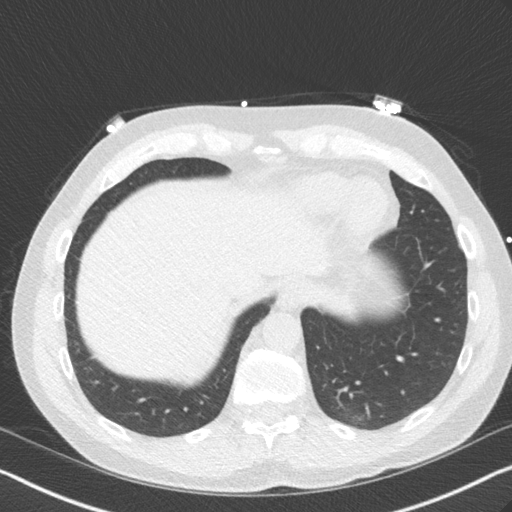
[im 16/48  lung]
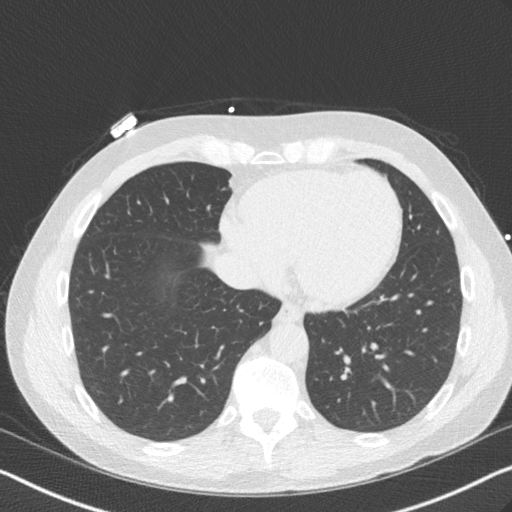
[im 24/48  lung]
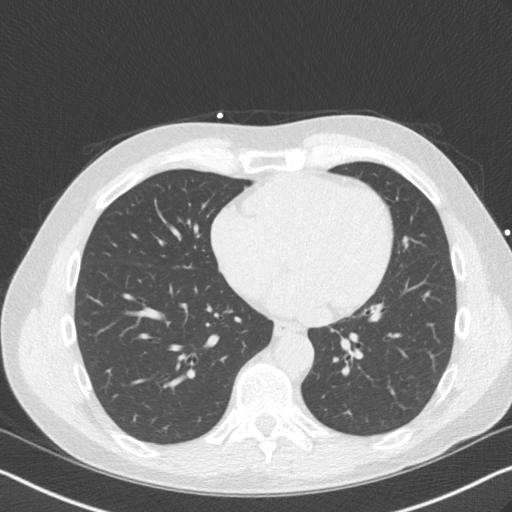
[im 32/48  lung]
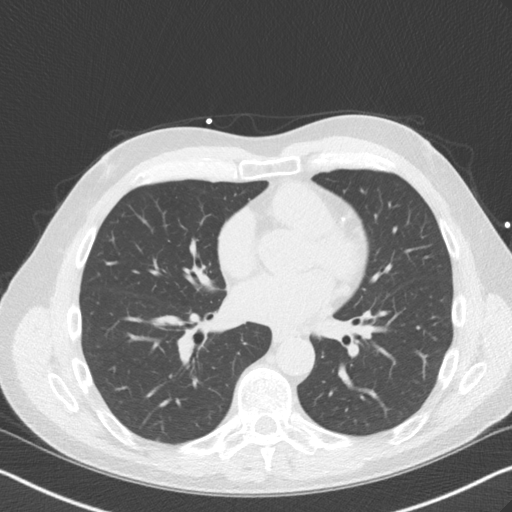
[im 40/48  lung]
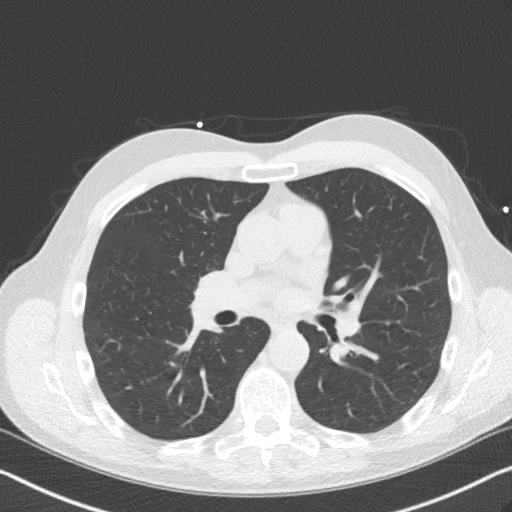

[14 of 20 positions shown; findings below may reference images not displayed]

FINDINGS: Aortic atherosclerosis. Within the visualized portions of the thorax
there are no suspicious appearing pulmonary nodules or masses, there
is no acute consolidative airspace disease, no pleural effusions, no
pneumothorax and no lymphadenopathy. Visualized portions of the
upper abdomen are unremarkable. There are no aggressive appearing
lytic or blastic lesions noted in the visualized portions of the
skeleton.
IMPRESSION: 1.  Aortic Atherosclerosis (RVNIK-9TB.B).
FINDINGS: Non-cardiac: See separate report from [REDACTED].

Ascending aorta: Normal size; mild atherosclerosis descending aorta

Pericardium: Normal

Coronary arteries: Normal origin
IMPRESSION: Coronary calcium score of 210. This was 75 percentile for age and
sex matched control.

Yoel Tiger

*** End of Addendum ***
EXAM:
OVER-READ INTERPRETATION  CT CHEST

The following report is an over-read performed by radiologist Dr.
Tholentz Pubnasyonal [REDACTED] on 08/31/2019. This
over-read does not include interpretation of cardiac or coronary
anatomy or pathology. The coronary calcium score interpretation by
the cardiologist is attached.
FINDINGS: Aortic atherosclerosis. Within the visualized portions of the thorax
there are no suspicious appearing pulmonary nodules or masses, there
is no acute consolidative airspace disease, no pleural effusions, no
pneumothorax and no lymphadenopathy. Visualized portions of the
upper abdomen are unremarkable. There are no aggressive appearing
lytic or blastic lesions noted in the visualized portions of the
skeleton.
IMPRESSION: 1.  Aortic Atherosclerosis (RVNIK-9TB.B).

## 2020-07-02 NOTE — Progress Notes (Deleted)
Cardiology Office Note:    Date:  07/02/2020   ID:  Jeremiah [name], DOB [birth year], MRN [id number]  PCP:  Jeremiah Branch, MD  Cardiologist:  No primary care provider on file.  Electrophysiologist:  None   Referring MD: Jeremiah Branch, MD   No chief complaint on file.   History of Present Illness:    Jeremiah [name] is a 65 y.o. male with a hx of basal cell carcinoma, erythema nodosum who presents for follow-up.  He was referred by Dr. Larose [name] for evaluation of palpitations, initially seen on 09/01/2019.  He reports that he has been having palpitations for years, was occurring infrequently.  Starting in December 2020, he started having more frequent palpitations.  States that during episodes his heart rhythm feels irregular.  Does not feel like heart is racing.  Typically lasts for around 15 minutes.  Denies any shortness of breath, chest pain, lightheadedness, or syncope.  Reports he has not had any palpitations since January 2021.  He is very active, reports he swims, bikes, and walks regularly.  He denies any exertional symptoms.  He has been checking his blood pressure regularly, recently has been 110s to 140s over 70s to 90s.  He had a calcium score done on 08/31/2019, which showed calcium score 210 (75th percentile).  No smoking history.  Father had an MI in his 80s.  Since last clinic visit,   he reports he has been doing well.  He has had one episode of palpitations in the last 3 months, which he caught on [place] monitor.  Kardia monitor strip shows sinus rhythm with PACs.  Lasted 5 minutes.  He continues to exercise 4 times per week, including swimming and biking.  He denies any chest pain, dyspnea, lightheadedness, syncope, or lower extremity edema.   Past Medical History:  Diagnosis Date  . Actinic keratitis   . BCC (basal cell carcinoma of skin)   . Erythema nodosum    per Bx 06-2010, d/t a virus, saw ID and rheumatology  . Hemorrhoids     Past Surgical History:  Procedure  Laterality Date  . REFRACTIVE SURGERY      Current Medications: No outpatient medications have been marked as taking for the 07/03/20 encounter (Appointment) with Jeremiah Heinz, MD.     Allergies:   Patient has no known allergies.   Social History   Socioeconomic History  . Marital status: Married    Spouse name: Not on file  . Number of children: 2  . Years of education: Not on file  . Highest education level: Not on file  Occupational History  . 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 Smoking status: Never Smoker  . Smokeless tobacco: Never Used  Substance and Sexual Activity  . Alcohol use: Yes    Comment: socially   . Drug use: No  . Sexual activity: Not on file  Other Topics Concern  . Not on file  Social History Narrative   Goes by  "[name]"    Very active, outdoors    Household- pt and wife    Social Determinants of Radio broadcast assistant Strain: Not on file  Food Insecurity: Not on file  Transportation Needs: Not on file  Physical Activity: Not on file  Stress: Not on file  Social Connections: Not on file     Family History: The patient's family history includes CAD in an other family member; Dementia in an other family  member; Heart disease in his father; Hyperlipidemia in his mother; Transient ischemic attack in his mother. There is no history of Jeremiah cancer, Prostate cancer, or Skin cancer.  ROS:   Please see the history of present illness.     All other systems reviewed and are negative.  EKGs/Labs/Other Studies Reviewed:    The following studies were reviewed today:   EKG:  EKG is not ordered today.  The ekg ordered most recently demonstrates normal sinus rhythm, rate 64, less than 1 mm ST depression in lead III  Recent Labs: 08/16/2019: BUN 17; Creatinine, Ser 0.98; Hemoglobin 14.4; Platelets 271.0; Potassium 4.4; Sodium 139; TSH 1.47  Recent Lipid Panel    Component Value Date/Time   CHOL 95 (L)  12/03/2019 0946   TRIG 74 12/03/2019 0946   HDL 36 (L) 12/03/2019 0946   CHOLHDL 2.6 12/03/2019 0946   CHOLHDL 5 04/19/2019 0720   VLDL 28.4 04/19/2019 0720   LDLCALC 43 12/03/2019 0946    Physical Exam:    VS:  There were no vitals taken for this visit.    Wt Readings from Last 3 Encounters:  12/03/19 188 lb 6.4 oz (85.5 kg)  09/01/19 191 lb 3.2 oz (86.7 kg)  08/16/19 193 lb 2 oz (87.6 kg)     HFS:FSEL nourished, well developed in no acute distress HEENT: Normal NECK: No JVD; No carotid bruits LYMPHATICS: No lymphadenopathy CARDIAC: RRR, no murmurs, rubs, gallops RESPIRATORY:  Clear to auscultation without rales, wheezing or rhonchi  ABDOMEN: Soft, non-tender, non-distended MUSCULOSKELETAL:  No edema; No deformity  SKIN: Warm and dry NEUROLOGIC:  Alert and oriented x 3 PSYCHIATRIC:  Normal affect   ASSESSMENT:    No diagnosis found. PLAN:     CAD: Calcium score 210, 75th percentile for age/gender.  No anginal symptoms to suggest obstructive coronary disease.  LDL 125 on 04/19/2019.  Started on atorvastatin 40 mg in March 2021, LDL 43 on 12/03/2019  Palpitations: Cardiac monitor shows sinus rhythm with PACs during episode of palpitations.  Has had one episode of this since last 3 months.  Given infrequent episodes, no treatment indicated at this time  Hyperlipidemia: LDL 43 on 12/03/2019, continue atorvastatin 40 mg daily  RTC in***  Medication Adjustments/Labs and Tests Ordered: Current medicines are reviewed at length with the patient today.  Concerns regarding medicines are outlined above.  No orders of the defined types were placed in this encounter.  No orders of the defined types were placed in this encounter.   There are no Patient Instructions on file for this visit.   Signed, Jeremiah Heinz, MD  07/02/2020 1:23 PM    [facility]

## 2020-07-03 ENCOUNTER — Ambulatory Visit: Payer: No Typology Code available for payment source | Admitting: Cardiology

## 2020-07-07 ENCOUNTER — Telehealth: Payer: Self-pay

## 2020-07-07 ENCOUNTER — Other Ambulatory Visit: Payer: Self-pay

## 2020-07-07 ENCOUNTER — Telehealth (INDEPENDENT_AMBULATORY_CARE_PROVIDER_SITE_OTHER): Payer: BC Managed Care – PPO | Admitting: Cardiology

## 2020-07-07 DIAGNOSIS — I2510 Atherosclerotic heart disease of native coronary artery without angina pectoris: Secondary | ICD-10-CM | POA: Diagnosis not present

## 2020-07-07 DIAGNOSIS — E785 Hyperlipidemia, unspecified: Secondary | ICD-10-CM | POA: Diagnosis not present

## 2020-07-07 DIAGNOSIS — R002 Palpitations: Secondary | ICD-10-CM | POA: Diagnosis not present

## 2020-07-07 NOTE — Patient Instructions (Signed)

## 2020-07-07 NOTE — Telephone Encounter (Signed)
Left message for patient to Call heart Care

## 2020-07-07 NOTE — Progress Notes (Signed)
Virtual Visit via Video Note   This visit type was conducted due to national recommendations for restrictions regarding the COVID-19 Pandemic (e.g. social distancing) in an effort to limit this patient's exposure and mitigate transmission in our community.  Due to his co-morbid illnesses, this patient is at least at moderate risk for complications without adequate follow up.  This format is felt to be most appropriate for this patient at this time.  All issues noted in this document were discussed and addressed.  A limited physical exam was performed with this format.  Please refer to the patient's chart for his consent to telehealth for [facility].      Date:  07/07/2020   ID:  Jeremiah [name], DOB [birth year], MRN [id number]  Patient Location: Home Provider Location: Home Office  PCP:  Colon Branch, MD  Cardiologist:  No primary care provider on file.  Electrophysiologist:  None   Evaluation Performed:  Follow-Up Visit  Chief Complaint:  CAD  History of Present Illness:    Jeremiah [name] is a 65 y.o. male with a hx of basal cell carcinoma, erythema nodosum who presents for follow-up.  He was referred by Dr. Larose Kells for evaluation of palpitations, initially seen on 09/01/2019.  He reports that he has been having palpitations for years, was occurring infrequently.  Starting in December 2020, he started having more frequent palpitations.  States that during episodes his heart rhythm feels irregular.  Does not feel like heart is racing.  Typically lasts for around 15 minutes.  Denies any shortness of breath, chest pain, lightheadedness, or syncope.  Reports he has not had any palpitations since January 2021.  He is very active, reports he swims, bikes, and walks regularly.  He denies any exertional symptoms.  He has been checking his blood pressure regularly, recently has been 110s to 140s over 70s to 90s.  He had a calcium score done on 08/31/2019, which showed calcium score 210 (75th  percentile).  No smoking history.  Father had an MI in his 24s.  Since last clinic visit, he reports that he has been doing well.  Denies any further palpitations.  Remains very active, recently went on a hiking trip for 41 days.  Denies any exertional chest pain or dyspnea.  He denies any lightheadedness, syncope, or lower extremity edema.   Past Medical History:  Diagnosis Date  . Actinic keratitis   . BCC (basal cell carcinoma of skin)   . Erythema nodosum    per Bx 06-2010, d/t a virus, saw ID and rheumatology  . Hemorrhoids    Past Surgical History:  Procedure Laterality Date  . REFRACTIVE SURGERY       No outpatient medications have been marked as taking for the 07/07/20 encounter (Video Visit) with Donato Heinz, MD.     Allergies:   Patient has no known allergies.   Social History   Tobacco Use  . Smoking status: Never Smoker  . Smokeless tobacco: Never Used  Substance Use Topics  . Alcohol use: Yes    Comment: socially   . Drug use: No     Family Hx: The patient's family history includes CAD in an other family member; Dementia in an other family member; Heart disease in his father; Hyperlipidemia in his mother; Transient ischemic attack in his mother. There is no history of Colon cancer, Prostate cancer, or Skin cancer.  ROS:   Please see the history of present illness.     All  other systems reviewed and are negative.   Prior CV studies:   The following studies were reviewed today:    Labs/Other Tests and Data Reviewed:    EKG:  No ECG reviewed.  Recent Labs: 08/16/2019: BUN 17; Creatinine, Ser 0.98; Hemoglobin 14.4; Platelets 271.0; Potassium 4.4; Sodium 139; TSH 1.47   Recent Lipid Panel Lab Results  Component Value Date/Time   CHOL 95 (L) 12/03/2019 09:46 AM   TRIG 74 12/03/2019 09:46 AM   HDL 36 (L) 12/03/2019 09:46 AM   CHOLHDL 2.6 12/03/2019 09:46 AM   CHOLHDL 5 04/19/2019 07:20 AM   LDLCALC 43 12/03/2019 09:46 AM    Wt Readings  from Last 3 Encounters:  12/03/19 188 lb 6.4 oz (85.5 kg)  09/01/19 191 lb 3.2 oz (86.7 kg)  08/16/19 193 lb 2 oz (87.6 kg)     Objective:    Vital Signs:  There were no vitals taken for this visit.   VITAL SIGNS:  reviewed GEN:  no acute distress, appears comfortable  ASSESSMENT & PLAN:     CAD: Calcium score 210, 75th percentile for age/gender.  No anginal symptoms to suggest obstructive coronary disease.  LDL 125 on 04/19/2019.  Started on atorvastatin 40 mg in March 2021, LDL 43 on 12/03/2019.  Continue atorvastatin  Palpitations: Cardiac monitor shows sinus rhythm with PACs during episode of palpitations.  Reports palpitations have resolved.  Hyperlipidemia: LDL 43 on 12/03/2019, continue atorvastatin 40 mg daily  RTC in 1 year    Time:   Today, I have spent 10 minutes with the patient with telehealth technology discussing the above problems.     Medication Adjustments/Labs and Tests Ordered: Current medicines are reviewed at length with the patient today.  Concerns regarding medicines are outlined above.   Tests Ordered: No orders of the defined types were placed in this encounter.   Medication Changes: No orders of the defined types were placed in this encounter.   Follow Up:  In Person in 1 year(s)  Signed, Donato Heinz, MD  07/07/2020 11:13 AM    [place]

## 2020-08-28 ENCOUNTER — Other Ambulatory Visit: Payer: Self-pay

## 2020-08-28 ENCOUNTER — Ambulatory Visit (INDEPENDENT_AMBULATORY_CARE_PROVIDER_SITE_OTHER): Payer: BC Managed Care – PPO | Admitting: Internal Medicine

## 2020-08-28 ENCOUNTER — Encounter: Payer: Self-pay | Admitting: Internal Medicine

## 2020-08-28 VITALS — BP 126/78 | HR 64 | Temp 97.8°F | Resp 16 | Ht 71.0 in | Wt 187.2 lb

## 2020-08-28 DIAGNOSIS — E785 Hyperlipidemia, unspecified: Secondary | ICD-10-CM | POA: Diagnosis not present

## 2020-08-28 DIAGNOSIS — Z0000 Encounter for general adult medical examination without abnormal findings: Secondary | ICD-10-CM

## 2020-08-28 NOTE — Progress Notes (Signed)
   Subjective:    Patient ID: Jeremiah [name], male    DOB: 14-May-1956, 65 y.o.   MRN: [id number]  DOS:  08/28/2020 Type of visit - description: CPX Since the last office visit saw cardiology, on cholesterol medication.  Doing well. Denies sxs    Review of Systems    A 14 point review of systems is negative    Past Medical History:  Diagnosis Date  . Actinic keratitis   . BCC (basal cell carcinoma of skin)   . Erythema nodosum    per Bx 06-2010, d/t a virus, saw ID and rheumatology  . Hemorrhoids     Past Surgical History:  Procedure Laterality Date  . REFRACTIVE SURGERY      Allergies as of 08/28/2020   No Known Allergies     Medication List       Accurate as of August 28, 2020 11:59 PM. If you have any questions, ask your nurse or doctor.        atorvastatin 40 MG tablet Commonly known as: LIPITOR Take 1 tablet (40 mg total) by mouth daily.          Objective:   Physical Exam BP 126/78 (BP Location: Left Arm, Patient Position: Sitting, Cuff Size: Small)   Pulse 64   Temp 97.8 F (36.6 C) (Oral)   Resp 16   Ht 5\' 11"  (1.803 m)   Wt 187 lb 4 oz (84.9 kg)   SpO2 98%   BMI 26.12 kg/m  General: Well developed, NAD, BMI noted Neck: No  thyromegaly  HEENT:  Normocephalic . Face symmetric, atraumatic Lungs:  CTA B Normal respiratory effort, no intercostal retractions, no accessory muscle use. Heart: RRR,  no murmur.  Abdomen:  Not distended, soft, non-tender. No rebound or rigidity.   Lower extremities: no pretibial edema bilaterally DRE: Normal sphincter tone, brown stools, prostate normal Skin: Exposed areas without rash. Not pale. Not jaundice Neurologic:  alert & oriented X3.  Speech normal, gait appropriate for age and unassisted Strength symmetric and appropriate for age.  Psych: Cognition and judgment appear intact.  Cooperative with normal attention span and concentration.  Behavior appropriate. No anxious or depressed appearing.      Assessment       Assessment Erythema nodosum, + biopsy 2012, d/t a virus, saw ID and rheumatology [facility] R leg, 03/2019 FH CAD F (MI age 65); coronary calcium score March 2021: 210, Rx control CVRF, on statins.  PLAN: Here for CPX BCC: rec to see derm q year + Coronary calcium score, FH CAD: Plan is to control CV RF. Good compliance with statins, checking labs. RTC 1 year   This visit occurred during the SARS-CoV-2 public health emergency.  Safety protocols were in place, including screening questions prior to the visit, additional usage of staff PPE, and extensive cleaning of exam room while observing appropriate contact time as indicated for disinfecting solutions.

## 2020-08-28 NOTE — Patient Instructions (Addendum)
Continue same medications.  Call for refills when needed. Please arrange for a colonoscopy this year  GO TO THE LAB : Get the blood work     [place], [place] back for   a physical exam in 1 year   Advance Directive  Advance directives are legal documents that allow you to make decisions about your health care and medical treatment in case you become unable to communicate for yourself. Advance directives let your wishes be known to family, friends, and health care providers. Discussing and writing advance directives should happen over time rather than all at once. Advance directives can be changed and updated at any time. There are different types of advance directives, such as:  Medical power of attorney.  Living will.  Do not resuscitate (DNR) order or do not attempt resuscitation (DNAR) order. Health care proxy and medical power of attorney A health care proxy is also called a health care agent. This person is appointed to make medical decisions for you when you are unable to make decisions for yourself. Generally, people ask a trusted friend or family member to act as their proxy and represent their preferences. Make sure you have an agreement with your trusted person to act as your proxy. A proxy may have to make a medical decision on your behalf if your wishes are not known. A medical power of attorney, also called a durable power of attorney for health care, is a legal document that names your health care proxy. Depending on the laws in your state, the document may need to be:  Signed.  Notarized.  Dated.  Copied.  Witnessed.  Incorporated into your medical record. You may also want to appoint a trusted person to manage your money in the event you are unable to do so. This is called a durable power of attorney for finances. It is a separate legal document from the durable power of attorney for health care. You may choose your health  care proxy or someone different to act as your agent in money matters. If you do not appoint a proxy, or there is a concern that the proxy is not acting in your best interest, a court may appoint a guardian to act on your behalf. Living will A living will is a set of instructions that state your wishes about medical care when you cannot express them yourself. Health care providers should keep a copy of your living will in your medical record. You may want to give a copy to family members or friends. To alert caregivers in case of an emergency, you can place a card in your wallet to let them know that you have a living will and where they can find it. A living will is used if you become:  Terminally ill.  Disabled.  Unable to communicate or make decisions. The following decisions should be included in your living will:  To use or not to use life support equipment, such as dialysis machines and breathing machines (ventilators).  Whether you want a DNR or DNAR order. This tells health care providers not to use cardiopulmonary resuscitation (CPR) if breathing or heartbeat stops.  To use or not to use tube feeding.  To be given or not to be given food and fluids.  Whether you want comfort (palliative) care when the goal becomes comfort rather than a cure.  Whether you want to donate your organs and tissues. A living will does not give  instructions for distributing your money and property if you should pass away. DNR or DNAR A DNR or DNAR order is a request not to have CPR in the event that your heart stops beating or you stop breathing. If a DNR or DNAR order has not been made and shared, a health care provider will try to help any patient whose heart has stopped or who has stopped breathing. If you plan to have surgery, talk with your health care provider about how your DNR or DNAR order will be followed if problems occur. What if I do not have an advance directive? Some states assign family  decision makers to act on your behalf if you do not have an advance directive. Each state has its own laws about advance directives. You may want to check with your health care provider, attorney, or state representative about the laws in your state. Summary  Advance directives are legal documents that allow you to make decisions about your health care and medical treatment in case you become unable to communicate for yourself.  The process of discussing and writing advance directives should happen over time. You can change and update advance directives at any time.  Advance directives may include a medical power of attorney, a living will, and a DNR or DNAR order. This information is not intended to replace advice given to you by your health care provider. Make sure you discuss any questions you have with your health care provider. Document Revised: 03/14/2020 Document Reviewed: 03/14/2020 Elsevier Patient Education  2021 [employer].

## 2020-08-29 ENCOUNTER — Other Ambulatory Visit: Payer: Self-pay

## 2020-08-29 ENCOUNTER — Encounter: Payer: Self-pay | Admitting: Internal Medicine

## 2020-08-29 LAB — CBC WITH DIFFERENTIAL/PLATELET
Basophils Absolute: 0 10*3/uL (ref 0.0–0.1)
Basophils Relative: 0.6 % (ref 0.0–3.0)
Eosinophils Absolute: 0.3 10*3/uL (ref 0.0–0.7)
Eosinophils Relative: 4.8 % (ref 0.0–5.0)
HCT: 40.9 % (ref 39.0–52.0)
Hemoglobin: 13.9 g/dL (ref 13.0–17.0)
Lymphocytes Relative: 27.4 % (ref 12.0–46.0)
Lymphs Abs: 1.6 10*3/uL (ref 0.7–4.0)
MCHC: 33.8 g/dL (ref 30.0–36.0)
MCV: 90.4 fl (ref 78.0–100.0)
Monocytes Absolute: 0.4 10*3/uL (ref 0.1–1.0)
Monocytes Relative: 7.3 % (ref 3.0–12.0)
Neutro Abs: 3.4 10*3/uL (ref 1.4–7.7)
Neutrophils Relative %: 59.9 % (ref 43.0–77.0)
Platelets: 239 10*3/uL (ref 150.0–400.0)
RBC: 4.53 Mil/uL (ref 4.22–5.81)
RDW: 13 % (ref 11.5–15.5)
WBC: 5.7 10*3/uL (ref 4.0–10.5)

## 2020-08-29 LAB — COMPREHENSIVE METABOLIC PANEL
ALT: 26 U/L (ref 0–53)
AST: 32 U/L (ref 0–37)
Albumin: 4.3 g/dL (ref 3.5–5.2)
Alkaline Phosphatase: 78 U/L (ref 39–117)
BUN: 17 mg/dL (ref 6–23)
CO2: 31 mEq/L (ref 19–32)
Calcium: 8.9 mg/dL (ref 8.4–10.5)
Chloride: 104 mEq/L (ref 96–112)
Creatinine, Ser: 1.09 mg/dL (ref 0.40–1.50)
GFR: 72.12 mL/min (ref >60.00)
Glucose, Bld: 73 mg/dL (ref 70–99)
Potassium: 4.5 mEq/L (ref 3.5–5.1)
Sodium: 141 mEq/L (ref 135–145)
Total Bilirubin: 0.7 mg/dL (ref 0.2–1.2)
Total Protein: 6.6 g/dL (ref 6.0–8.3)

## 2020-08-29 LAB — LIPID PANEL
Cholesterol: 113 mg/dL (ref 0–200)
HDL: 42.7 mg/dL (ref >39.00)
LDL Cholesterol: 49 mg/dL (ref 0–99)
NonHDL: 70.38
Total CHOL/HDL Ratio: 3
Triglycerides: 108 mg/dL (ref 0.0–149.0)
VLDL: 21.6 mg/dL (ref 0.0–40.0)

## 2020-08-29 LAB — PSA: PSA: 0.4 ng/mL (ref 0.10–4.00)

## 2020-08-29 MED ORDER — ATORVASTATIN CALCIUM 40 MG PO TABS: 40.0000 mg | ORAL_TABLET | Freq: Every day | ORAL | 3 refills | Status: DC

## 2020-08-29 NOTE — Assessment & Plan Note (Signed)
-  Td 12-2015 - s/p shingrix x 2 -COVID VAX x3 -  flu shot: Declined -CCS: Cscope  2001, negative per patient (done @ [facility]),cscope again in 2012: no polyps, overdue for colonoscopy, we agreed he will reach out to GI this year and get it done. -prostate cancer screening: DRE normal, check a PSA -Labs:  CMP, FLP, CBC, PSA  -Has a great lifestyle, very active

## 2020-08-29 NOTE — Assessment & Plan Note (Signed)
Here for CPX BCC: rec to see derm q year + Coronary calcium score, FH CAD: Plan is to control CV RF. Good compliance with statins, checking labs. RTC 1 year

## 2020-08-31 ENCOUNTER — Other Ambulatory Visit: Payer: Self-pay | Admitting: Cardiology

## 2020-08-31 NOTE — Telephone Encounter (Signed)
This is Dr. Schumann's pt 

## 2020-09-14 ENCOUNTER — Encounter: Payer: Self-pay | Admitting: Internal Medicine

## 2021-08-15 ENCOUNTER — Encounter: Payer: Self-pay | Admitting: Internal Medicine

## 2021-09-02 NOTE — Progress Notes (Unsigned)
Cardiology Office Note:    Date:  09/02/2021   ID:  Jeremiah [name], DOB Dec 26, 1955, MRN [id number]  PCP:  Colon Branch, MD  Cardiologist:  None  Electrophysiologist:  None   Referring MD: Colon Branch, MD   No chief complaint on file.   History of Present Illness:    Jeremiah [name] is a 66 y.o. male with a hx of basal cell carcinoma, erythema nodosum who presents for follow-up.  He was referred by Dr. Larose Kells for evaluation of palpitations, initially seen on 09/01/2019.  He reports that he has been having palpitations for years, was occurring infrequently.  Starting in December 2020, he started having more frequent palpitations.  States that during episodes his heart rhythm feels irregular.  Does not feel like heart is racing.  Typically lasts for around 15 minutes.  Denies any shortness of breath, chest pain, lightheadedness, or syncope.  Reports he has not had any palpitations since January 2021.  He is very active, reports he swims, bikes, and walks regularly.  He denies any exertional symptoms.  He has been checking his blood pressure regularly, recently has been 110s to 140s over 70s to 90s.  He had a calcium score done on 08/31/2019, which showed calcium score 210 (75th percentile).  No smoking history.  Father had an MI in his 24s.   Since last clinic visit,  he reports that he has been doing well.  Denies any further palpitations.  Remains very active, recently went on a hiking trip for 41 days.  Denies any exertional chest pain or dyspnea.  He denies any lightheadedness, syncope, or lower extremity edema.   Past Medical History:  Diagnosis Date   Actinic keratitis    BCC (basal cell carcinoma of skin)    Erythema nodosum    per Bx 06-2010, d/t a virus, saw ID and rheumatology   Hemorrhoids     Past Surgical History:  Procedure Laterality Date   REFRACTIVE SURGERY      Current Medications: No outpatient medications have been marked as taking for the 09/05/21 encounter  (Appointment) with Donato Heinz, MD.     Allergies:   Patient has no known allergies.   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[employer])   Tobacco Use   Smoking status: Never   Smokeless tobacco: Never  Substance and Sexual Activity   Alcohol use: Yes    Comment: socially    Drug use: No   Sexual activity: Not on file  Other Topics Concern   Not on file  Social History Narrative   Goes by  "[name]"    Very active, outdoors    Household- pt and wife    Social Determinants of Radio broadcast assistant Strain: Not on file  Food Insecurity: Not on file  Transportation Needs: Not on file  Physical Activity: Not on file  Stress: Not on file  Social Connections: Not on file     Family History: The patient's family history includes CAD in an other family member; Dementia in an other family member; Heart disease in his father; Hyperlipidemia in his mother; Transient ischemic attack in his mother. There is no history of Colon cancer, Prostate cancer, or Skin cancer.  ROS:   Please see the history of present illness.     All other systems reviewed and are negative.  EKGs/Labs/Other Studies Reviewed:    The following studies were reviewed today:   EKG:  EKG is not ordered today.  The ekg ordered most recently demonstrates normal sinus rhythm, rate 64, less than 1 mm ST depression in lead III  Recent Labs: No results found for requested labs within last 8760 hours.  Recent Lipid Panel    Component Value Date/Time   CHOL 113 08/28/2020 1439   CHOL 95 (L) 12/03/2019 0946   TRIG 108.0 08/28/2020 1439   HDL 42.70 08/28/2020 1439   HDL 36 (L) 12/03/2019 0946   CHOLHDL 3 08/28/2020 1439   VLDL 21.6 08/28/2020 1439   LDLCALC 49 08/28/2020 1439    LDLCALC 43 12/03/2019 0946    Physical Exam:    VS:  There were no vitals taken for this visit.    Wt Readings from Last 3 Encounters:  08/28/20 187 lb 4 oz (84.9 kg)  12/03/19 188 lb 6.4 oz (85.5 kg)  09/01/19 191 lb 3.2 oz (86.7 kg)     ASN:KNLZ nourished, well developed in no acute distress HEENT: Normal NECK: No JVD; No carotid bruits LYMPHATICS: No lymphadenopathy CARDIAC: RRR, no murmurs, rubs, gallops RESPIRATORY:  Clear to auscultation without rales, wheezing or rhonchi  ABDOMEN: Soft, non-tender, non-distended MUSCULOSKELETAL:  No edema; No deformity  SKIN: Warm and dry NEUROLOGIC:  Alert and oriented x 3 PSYCHIATRIC:  Normal affect   ASSESSMENT:    No diagnosis found.  PLAN:     CAD: Calcium score 210, 75th percentile for age/gender.  No anginal symptoms to suggest obstructive coronary disease.  LDL 125 on 04/19/2019.  Started on atorvastatin 40 mg in March 2021, LDL 43 on 12/03/2019.  Continue atorvastatin   Palpitations: Cardiac monitor shows sinus rhythm with PACs during episode of palpitations.  Reports palpitations have resolved.   Hyperlipidemia: LDL 43 on 12/03/2019, continue atorvastatin 40 mg daily   RTC in 1 year     Medication Adjustments/Labs and Tests Ordered: Current medicines are reviewed at length with the patient today.  Concerns regarding medicines are outlined above.  No orders of the defined types were placed in this encounter.  No orders of the defined types were placed in this encounter.   There are no Patient Instructions on file for this visit.   Signed, Donato Heinz, MD  09/02/2021 5:09 PM    [place]

## 2021-09-05 ENCOUNTER — Other Ambulatory Visit: Payer: Self-pay

## 2021-09-05 ENCOUNTER — Ambulatory Visit: Payer: BC Managed Care – PPO | Admitting: Cardiology

## 2021-09-05 ENCOUNTER — Encounter: Payer: Self-pay | Admitting: Cardiology

## 2021-09-05 VITALS — BP 115/62 | HR 61 | Ht 72.0 in | Wt 192.6 lb

## 2021-09-05 DIAGNOSIS — E785 Hyperlipidemia, unspecified: Secondary | ICD-10-CM

## 2021-09-05 DIAGNOSIS — I2510 Atherosclerotic heart disease of native coronary artery without angina pectoris: Secondary | ICD-10-CM

## 2021-09-05 DIAGNOSIS — R002 Palpitations: Secondary | ICD-10-CM | POA: Diagnosis not present

## 2021-09-05 MED ORDER — ATORVASTATIN CALCIUM 40 MG PO TABS: 40.0000 mg | ORAL_TABLET | Freq: Every day | ORAL | 3 refills | Status: DC

## 2021-09-05 NOTE — Addendum Note (Signed)
Addended by: Patria Mane A on: 09/05/2021 02:47 PM ? ? Modules accepted: Orders ? ?

## 2021-09-05 NOTE — Patient Instructions (Signed)
Medication Instructions:  ?Your physician recommends that you continue on your current medications as directed. Please refer to the Current Medication list given to you today. ? ?*If you need a refill on your cardiac medications before your next appointment, please call your pharmacy* ? ?Follow-Up: ?At [facility], you and your health needs are our priority.  As part of our continuing mission to provide you with exceptional heart care, we have created designated Provider Care Teams.  These Care Teams include your primary Cardiologist (physician) and Advanced Practice Providers (APPs -  Physician Assistants and Nurse Practitioners) who all work together to provide you with the care you need, when you need it. ? ?We recommend signing up for the patient portal called "MyChart".  Sign up information is provided on this After Visit Summary.  MyChart is used to connect with patients for Virtual Visits (Telemedicine).  Patients are able to view lab/test results, encounter notes, upcoming appointments, etc.  Non-urgent messages can be sent to your provider as well.   ?To learn more about what you can do with MyChart, go to [email or web address].   ? ?Your next appointment:   ?12 month(s) ? ?The format for your next appointment:   ?In Person ? ?Provider:   ?Donato Heinz, MD { ? ? ? ?

## 2021-09-26 ENCOUNTER — Encounter: Payer: BC Managed Care – PPO | Admitting: Internal Medicine

## 2021-10-01 ENCOUNTER — Encounter: Payer: Self-pay | Admitting: Internal Medicine

## 2021-10-01 ENCOUNTER — Ambulatory Visit (INDEPENDENT_AMBULATORY_CARE_PROVIDER_SITE_OTHER): Payer: BC Managed Care – PPO | Admitting: Internal Medicine

## 2021-10-01 VITALS — BP 112/74 | HR 63 | Temp 98.1°F | Resp 12 | Ht 72.4 in | Wt 191.0 lb

## 2021-10-01 DIAGNOSIS — Z0000 Encounter for general adult medical examination without abnormal findings: Secondary | ICD-10-CM | POA: Diagnosis not present

## 2021-10-01 DIAGNOSIS — I2510 Atherosclerotic heart disease of native coronary artery without angina pectoris: Secondary | ICD-10-CM

## 2021-10-01 DIAGNOSIS — I2583 Coronary atherosclerosis due to lipid rich plaque: Secondary | ICD-10-CM | POA: Diagnosis not present

## 2021-10-01 LAB — CBC WITH DIFFERENTIAL/PLATELET
Basophils Absolute: 0 10*3/uL (ref 0.0–0.1)
Basophils Relative: 0.4 % (ref 0.0–3.0)
Eosinophils Absolute: 0.2 10*3/uL (ref 0.0–0.7)
Eosinophils Relative: 4.4 % (ref 0.0–5.0)
HCT: 41.9 % (ref 39.0–52.0)
Hemoglobin: 14.2 g/dL (ref 13.0–17.0)
Lymphocytes Relative: 27.5 % (ref 12.0–46.0)
Lymphs Abs: 1.5 10*3/uL (ref 0.7–4.0)
MCHC: 33.9 g/dL (ref 30.0–36.0)
MCV: 90.5 fl (ref 78.0–100.0)
Monocytes Absolute: 0.4 10*3/uL (ref 0.1–1.0)
Monocytes Relative: 6.7 % (ref 3.0–12.0)
Neutro Abs: 3.4 10*3/uL (ref 1.4–7.7)
Neutrophils Relative %: 61 % (ref 43.0–77.0)
Platelets: 221 10*3/uL (ref 150.0–400.0)
RBC: 4.64 Mil/uL (ref 4.22–5.81)
RDW: 13.2 % (ref 11.5–15.5)
WBC: 5.6 10*3/uL (ref 4.0–10.5)

## 2021-10-01 LAB — COMPREHENSIVE METABOLIC PANEL
ALT: 26 U/L (ref 0–53)
AST: 30 U/L (ref 0–37)
Albumin: 4.6 g/dL (ref 3.5–5.2)
Alkaline Phosphatase: 71 U/L (ref 39–117)
BUN: 18 mg/dL (ref 6–23)
CO2: 31 mEq/L (ref 19–32)
Calcium: 9.2 mg/dL (ref 8.4–10.5)
Chloride: 103 mEq/L (ref 96–112)
Creatinine, Ser: 1 mg/dL (ref 0.40–1.50)
GFR: 79.37 mL/min (ref >60.00)
Glucose, Bld: 83 mg/dL (ref 70–99)
Potassium: 4.4 mEq/L (ref 3.5–5.1)
Sodium: 139 mEq/L (ref 135–145)
Total Bilirubin: 0.8 mg/dL (ref 0.2–1.2)
Total Protein: 7 g/dL (ref 6.0–8.3)

## 2021-10-01 LAB — LIPID PANEL
Cholesterol: 131 mg/dL (ref 0–200)
HDL: 46.6 mg/dL (ref >39.00)
LDL Cholesterol: 65 mg/dL (ref 0–99)
NonHDL: 83.98
Total CHOL/HDL Ratio: 3
Triglycerides: 93 mg/dL (ref 0.0–149.0)
VLDL: 18.6 mg/dL (ref 0.0–40.0)

## 2021-10-01 NOTE — Patient Instructions (Signed)
? ? ?  GO TO THE LAB : Get the blood work   ? ? ?[place], [place] ?Come back for   a physical exam in 1 year ? ? ? ?"Living will", "Health Care Power of attorney": Advanced care planning ? ?(If you already have a living will or healthcare power of attorney, please bring the copy to be scanned in your chart.) ? ?Advance care planning is a process that supports adults in  understanding and sharing their preferences regarding future medical care.  ? ?The patient's preferences are recorded in documents called Advance Directives.    ?Advanced directives are completed (and can be modified at any time) while the patient is in full mental capacity.  ? ?The documentation should be available at all times to the patient, the family and the healthcare providers.  ?Bring in a copy to be scanned in your chart is an excellent idea and is recommended  ? ?This legal documents direct treatment decision making and/or appoint a surrogate to make the decision if the patient is not capable to do so.  ? ? ?Advance directives can be documented in many types of formats,  documents have names such as:  ?Lliving will  ?Durable power of attorney for healthcare (healthcare proxy or healthcare power of attorney)  ?Combined directives  ?Physician orders for life-sustaining treatment  ?  ?More information at: ? [email or web address]  ?

## 2021-10-01 NOTE — Progress Notes (Signed)
? ?Subjective:  ? ? Patient ID: Jeremiah [name], male    DOB: Nov 05, 1955, 66 y.o.   MRN: [id number] ? ?DOS:  10/01/2021 ?Type of visit - description: cpx ? ?Here for CPX. ?Feeling great. ?Has no concerns ? ?Review of Systems ? ?A 14 point review of systems is negative  ? ? ?Past Medical History:  ?Diagnosis Date  ? Actinic keratitis   ? BCC (basal cell carcinoma of skin)   ? Erythema nodosum   ? per Bx 06-2010, d/t a virus, saw ID and rheumatology  ? Hemorrhoids   ? ? ?Past Surgical History:  ?Procedure Laterality Date  ? REFRACTIVE SURGERY    ? ?Social History  ? ?Socioeconomic History  ? Marital status: Married  ?  Spouse name: Not on file  ? Number of children: 2  ? Years of education: Not on file  ? Highest education level: Not on file  ?Occupational History  ? 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 Smoking status: Never  ? Smokeless tobacco: Never  ?Substance and Sexual Activity  ? Alcohol use: Yes  ?  Comment: socially   ? Drug use: No  ? Sexual activity: Not on file  ?Other Topics Concern  ? Not on file  ?Social History Narrative  ? Goes by  "[name]"   ? Very active, outdoors   ? Household- pt and wife   ? ?Social Determinants of Health  ? ?Financial Resource Strain: Not on file  ?Food Insecurity: Not on file  ?Transportation Needs: Not on file  ?Physical Activity: Not on file  ?Stress: Not on file  ?Social Connections: Not on file  ?Intimate Partner Violence: Not on file  ? ? ?Current Outpatient Medications  ?Medication Instructions  ? atorvastatin (LIPITOR) 40 mg, Oral, Daily  ? ? ?   ?Objective:  ? Physical Exam ?BP 112/74 (BP Location: Right Arm, Cuff Size: Large)   Pulse 63   Temp 98.1 ?F (36.7 ?C) (Oral)   Resp 12   Ht 6' 0.4" (1.839 m)   Wt 191 lb (86.6 kg)   SpO2 99%   BMI 25.62 kg/m?  ?General: ?Well developed, NAD, BMI noted ?Neck: No  thyromegaly  ?HEENT:  ?Normocephalic . Face symmetric, atraumatic ?Lungs:  ?CTA B ?Normal respiratory effort, no intercostal  retractions, no accessory muscle use. ?Heart: RRR,  no murmur.  ?Abdomen:  ?Not distended, soft, non-tender. No rebound or rigidity.   ?Lower extremities: no pretibial edema bilaterally  ?Skin: Exposed areas without rash. Not pale. Not jaundice ?Neurologic:  ?alert & oriented X3.  ?Speech normal, gait appropriate for age and unassisted ?Strength symmetric and appropriate for age.  ?Psych: ?Cognition and judgment appear intact.  ?Cooperative with normal attention span and concentration.  ?Behavior appropriate. ?No anxious or depressed appearing. ? ?   ?Assessment   ? ?  ?Assessment ?Erythema nodosum, + biopsy 2012, d/t a virus, saw ID and rheumatology ?BCC R leg, 03/2019 ?CAD:  FH CAD F (MI age 68); coronary calcium score March 2021: 210, Rx control CVRF, on statins. ? ?PLAN: ?Here for CPX ?CAD: per Co CA+ Score, saw cards recently, felt to be stable.  On Lipitor, checking labs ?BCC: Recommend to see dermatology regularly, also discussed self skin check. ?RTC 1 year  ? ? ? ? ?This visit occurred during the SARS-CoV-2 public health emergency.  Safety protocols were in place, including screening questions prior to the visit, additional usage of staff PPE, and extensive cleaning of exam  room while observing appropriate contact time as indicated for disinfecting solutions.  ? ?

## 2021-10-02 ENCOUNTER — Encounter: Payer: Self-pay | Admitting: Internal Medicine

## 2021-10-02 NOTE — Assessment & Plan Note (Signed)
Here for CPX ?CAD: per Co CA+ Score, saw cards recently, felt to be stable.  On Lipitor, checking labs ?BCC: Recommend to see dermatology regularly, also discussed self skin check. ?RTC 1 year  ?

## 2021-10-02 NOTE — Assessment & Plan Note (Signed)
-  Td 12-2015 ?- s/p shingrix x 2 ?-COVID VAX - utd  ?-CCS: ?Cscope  2001, negative per patient (done @ [facility]),cscope again in 2012: no polyps.  Already planning to reach out to GI for colonoscopy ?-prostate cancer screening: DR- PSA wnl 07/2020 ?-Labs:  CMP, FLP, CBC (nonfasting) ? -Has a great lifestyle, very active ?-ACP information provided ? ?  ?

## 2022-02-18 ENCOUNTER — Encounter: Payer: Self-pay | Admitting: Internal Medicine

## 2022-04-12 DIAGNOSIS — L820 Inflamed seborrheic keratosis: Secondary | ICD-10-CM | POA: Diagnosis not present

## 2022-04-12 DIAGNOSIS — B354 Tinea corporis: Secondary | ICD-10-CM | POA: Diagnosis not present

## 2022-04-12 DIAGNOSIS — Z85828 Personal history of other malignant neoplasm of skin: Secondary | ICD-10-CM | POA: Diagnosis not present

## 2022-04-12 DIAGNOSIS — D225 Melanocytic nevi of trunk: Secondary | ICD-10-CM | POA: Diagnosis not present

## 2022-05-08 DIAGNOSIS — Z1211 Encounter for screening for malignant neoplasm of colon: Secondary | ICD-10-CM | POA: Diagnosis not present

## 2022-05-08 LAB — HM COLONOSCOPY

## 2022-05-09 DIAGNOSIS — D3131 Benign neoplasm of right choroid: Secondary | ICD-10-CM | POA: Diagnosis not present

## 2022-05-09 DIAGNOSIS — H5213 Myopia, bilateral: Secondary | ICD-10-CM | POA: Diagnosis not present

## 2022-05-22 DIAGNOSIS — Z1211 Encounter for screening for malignant neoplasm of colon: Secondary | ICD-10-CM | POA: Diagnosis not present

## 2022-07-12 ENCOUNTER — Ambulatory Visit (INDEPENDENT_AMBULATORY_CARE_PROVIDER_SITE_OTHER): Payer: Medicare HMO | Admitting: Internal Medicine

## 2022-07-12 ENCOUNTER — Encounter: Payer: Self-pay | Admitting: Internal Medicine

## 2022-07-12 VITALS — BP 126/68 | HR 55 | Temp 97.8°F | Resp 16 | Ht 72.4 in | Wt 189.2 lb

## 2022-07-12 DIAGNOSIS — E785 Hyperlipidemia, unspecified: Secondary | ICD-10-CM | POA: Diagnosis not present

## 2022-07-12 DIAGNOSIS — Z23 Encounter for immunization: Secondary | ICD-10-CM

## 2022-07-12 DIAGNOSIS — Z125 Encounter for screening for malignant neoplasm of prostate: Secondary | ICD-10-CM | POA: Diagnosis not present

## 2022-07-12 DIAGNOSIS — Z0000 Encounter for general adult medical examination without abnormal findings: Secondary | ICD-10-CM

## 2022-07-12 NOTE — Progress Notes (Signed)
   Subjective:    Patient ID: Jeremiah [name], male    DOB: 1956/04/10, 67 y.o.   MRN: [id number]  DOS:  07/12/2022 Type of visit - description: cpx  Since the last office visit is doing well. Has no concerns or symptoms  Review of Systems   A 14 point review of systems is negative    Past Medical History:  Diagnosis Date   Actinic keratitis    BCC (basal cell carcinoma of skin)    Erythema nodosum    per Bx 06-2010, d/t a virus, saw ID and rheumatology   Hemorrhoids     Past Surgical History:  Procedure Laterality Date   REFRACTIVE SURGERY       Social History   Social History Narrative   Goes by  Jeremiah [name]"    Very active, outdoors    Household- pt and wife     Current Outpatient Medications  Medication Instructions   atorvastatin (LIPITOR) 40 mg, Oral, Daily       Objective:   Physical Exam BP 126/68   Pulse (!) 55   Temp 97.8 F (36.6 C) (Oral)   Resp 16   Ht 6' 0.4" (1.839 m)   Wt 189 lb 4 oz (85.8 kg)   SpO2 97%   BMI 25.38 kg/m  General: Well developed, NAD, BMI noted Neck: No  thyromegaly  HEENT:  Normocephalic . Face symmetric, atraumatic Lungs:  CTA B Normal respiratory effort, no intercostal retractions, no accessory muscle use. Heart: RRR,  no murmur.  Abdomen:  Not distended, soft, non-tender. No rebound or rigidity.   Lower extremities: no pretibial edema bilaterally  Skin: Exposed areas without rash. Not pale. Not jaundice Neurologic:  alert & oriented X3.  Speech normal, gait appropriate for age and unassisted Strength symmetric and appropriate for age.  Psych: Cognition and judgment appear intact.  Cooperative with normal attention span and concentration.  Behavior appropriate. No anxious or depressed appearing.     Assessment     Assessment Erythema nodosum, + biopsy 2012, d/t a virus, saw ID and rheumatology [facility] R leg, 03/2019 CAD:  FH CAD F (MI age 31); coronary calcium score March 2021: 210, Rx control CVRF, on  statins.  PLAN: Here for CPX BCC: Recommend to see Derm regularly CAD: per coronary Ca+ Score, asx  on statins, plan to see cardiology in few weeks. Social: Still very active, planning to take a 6-week trip to the [facility]  RTC 1 year

## 2022-07-12 NOTE — Patient Instructions (Addendum)
Vaccines I recommend:  Covid booster RSV vaccine    GO TO THE LAB : Get the blood work     [place], [place] back for a physical exam in 1 year    "[place] of attorney" ,  "Living will" (Advance care planning documents)  If you already have a living will or healthcare power of attorney, is recommended you bring the copy to be scanned in your chart.   The document will be available to all the doctors you see in the system.  Advance care planning is a process that supports adults in  understanding and sharing their preferences regarding future medical care.  The patient's preferences are recorded in documents called Advance Directives and the can be modified at any time while the patient is in full mental capacity.   If you don't have one, please consider create one.      More information at: [email or web address]

## 2022-07-13 LAB — CBC WITH DIFFERENTIAL/PLATELET
Absolute Monocytes: 454 cells/uL (ref 200–950)
Basophils Absolute: 38 cells/uL (ref 0–200)
Basophils Relative: 0.6 %
Eosinophils Absolute: 410 cells/uL (ref 15–500)
Eosinophils Relative: 6.4 %
HCT: 44.1 % (ref 38.5–50.0)
Hemoglobin: 15 g/dL (ref 13.2–17.1)
Lymphs Abs: 1869 cells/uL (ref 850–3900)
MCH: 30.9 pg (ref 27.0–33.0)
MCHC: 34 g/dL (ref 32.0–36.0)
MCV: 90.9 fL (ref 80.0–100.0)
MPV: 9.3 fL (ref 7.5–12.5)
Monocytes Relative: 7.1 %
Neutro Abs: 3629 cells/uL (ref 1500–7800)
Neutrophils Relative %: 56.7 %
Platelets: 247 10*3/uL (ref 140–400)
RBC: 4.85 10*6/uL (ref 4.20–5.80)
RDW: 12.2 % (ref 11.0–15.0)
Total Lymphocyte: 29.2 %
WBC: 6.4 10*3/uL (ref 3.8–10.8)

## 2022-07-13 LAB — COMPREHENSIVE METABOLIC PANEL
AG Ratio: 1.8 (calc) (ref 1.0–2.5)
ALT: 26 U/L (ref 9–46)
AST: 28 U/L (ref 10–35)
Albumin: 4.7 g/dL (ref 3.6–5.1)
Alkaline phosphatase (APISO): 71 U/L (ref 35–144)
BUN: 20 mg/dL (ref 7–25)
CO2: 27 mmol/L (ref 20–32)
Calcium: 9.3 mg/dL (ref 8.6–10.3)
Chloride: 103 mmol/L (ref 98–110)
Creat: 1.07 mg/dL (ref 0.70–1.35)
Globulin: 2.6 g/dL (calc) (ref 1.9–3.7)
Glucose, Bld: 76 mg/dL (ref 65–99)
Potassium: 5.3 mmol/L (ref 3.5–5.3)
Sodium: 141 mmol/L (ref 135–146)
Total Bilirubin: 0.8 mg/dL (ref 0.2–1.2)
Total Protein: 7.3 g/dL (ref 6.1–8.1)

## 2022-07-13 LAB — LIPID PANEL
Cholesterol: 127 mg/dL (ref <200)
HDL: 47 mg/dL (ref >=40)
LDL Cholesterol (Calc): 64 mg/dL (calc)
Non-HDL Cholesterol (Calc): 80 mg/dL (calc) (ref <130)
Total CHOL/HDL Ratio: 2.7 (calc) (ref <5.0)
Triglycerides: 82 mg/dL (ref <150)

## 2022-07-13 LAB — PSA: PSA: 0.44 ng/mL (ref <=4.00)

## 2022-07-14 ENCOUNTER — Encounter: Payer: Self-pay | Admitting: Internal Medicine

## 2022-07-14 NOTE — Assessment & Plan Note (Signed)
-  Td 12-2015 - s/p shingrix x 2 - RSV discussed - PNM 20: Today -[place] - utd - Flu shot today -CCS: Cscope  2001, negative per patient (done @ [facility]),cscope again in 2012: no polyps. Cscope 04-2022 neg per pt, 10 years  -prostate cancer screening: DR- PSA wnl 07/2020 -Labs:  CMP FLP CBC PSA (nonfasting)  -He remains very active, eats healthy. - Healthcare POA: See AVS

## 2022-07-14 NOTE — Assessment & Plan Note (Signed)
Here for CPX BCC: Recommend to see Derm regularly CAD: per coronary Ca+ Score, asx  on statins, plan to see cardiology in few weeks. Social: Still very active, planning to take a 6-week trip to the [facility]  RTC 1 year

## 2022-07-19 ENCOUNTER — Encounter: Payer: Self-pay | Admitting: Internal Medicine

## 2022-08-15 NOTE — Progress Notes (Signed)
Cardiology Office Note:    Date:  08/16/2022   ID:  Jeremiah [name], DOB [birth year], MRN [id number]  PCP:  Colon Branch, MD  Cardiologist:  Donato Heinz, MD  Electrophysiologist:  None   Referring MD: Colon Branch, MD   Chief Complaint  Patient presents with   Coronary Artery Disease    History of Present Illness:    Jeremiah [name] is a 67 y.o. male with a hx of CAD, basal cell carcinoma, erythema nodosum who presents for follow-up.  He was referred by Dr. Larose Kells for evaluation of palpitations, initially seen on 09/01/2019.  He reports that he has been having palpitations for years, was occurring infrequently.  Starting in December 2020, he started having more frequent palpitations.  States that during episodes his heart rhythm feels irregular.  Does not feel like heart is racing.  Typically lasts for around 15 minutes.  Denies any shortness of breath, chest pain, lightheadedness, or syncope.  Reports he has not had any palpitations since January 2021.  He is very active, reports he swims, bikes, and walks regularly.  He denies any exertional symptoms.  He has been checking his blood pressure regularly, recently has been 110s to 140s over 70s to 90s.  He had a calcium score done on 08/31/2019, which showed calcium score 210 (75th percentile).  No smoking history.  Father had an MI in his 19s.   Since last clinic visit, he reports he has been doing well.  About to go on 6-week camping trip in [place].  Denies any chest pain, dyspnea, lightheadedness, syncope, lower extremity edema.  Reports rare palpitations lasting seconds.  He does 4 cardio workouts per week, twice swimming and twice will walk/jog for 45 minutes.  Denies any exertional symptoms.    Past Medical History:  Diagnosis Date   Actinic keratitis    BCC (basal cell carcinoma of skin)    Erythema nodosum    per Bx 06-2010, d/t a virus, saw ID and rheumatology   Hemorrhoids     Past Surgical History:  Procedure  Laterality Date   REFRACTIVE SURGERY      Current Medications: Current Meds  Medication Sig   atorvastatin (LIPITOR) 40 MG tablet Take 1 tablet (40 mg total) by mouth daily.     Allergies:   Patient has no known allergies.   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[employer])   Tobacco Use   Smoking status: Never   Smokeless tobacco: Never  Substance and Sexual Activity   Alcohol use: Yes    Comment: socially    Drug use: No   Sexual activity: Not on file  Other Topics Concern   Not on file  Social History Narrative   Goes by  "[name]"    Very active, outdoors    Household- pt and wife    Social Determinants of Radio broadcast assistant Strain: Not on file  Food Insecurity: Not on file  Transportation Needs: Not on file  Physical Activity: Not on file  Stress: Not on file  Social Connections: Not on file     Family History: The patient's family history includes CAD in an other family member; Dementia in an other family member; Heart  disease in his father; Hyperlipidemia in his mother; Transient ischemic attack in his mother. There is no history of Colon cancer, Prostate cancer, or Skin cancer.  ROS:   Please see the history of present illness.     All other systems reviewed and are negative.  EKGs/Labs/Other Studies Reviewed:    The following studies were reviewed today:   EKG:   09/05/2021: Normal sinus rhythm, rate 61, RSR'  in V1 08/16/22: Sinus bradycardia, rate 57, no ST abnormality, RSR' in V1   Recent Labs: 07/12/2022: ALT 26; BUN 20; Creat 1.07; Hemoglobin 15.0; Platelets 247; Potassium 5.3; Sodium 141  Recent Lipid Panel    Component Value Date/Time   CHOL 127 07/12/2022 1454   CHOL 95 (L) 12/03/2019 0946   TRIG 82 07/12/2022 1454    HDL 47 07/12/2022 1454   HDL 36 (L) 12/03/2019 0946   CHOLHDL 2.7 07/12/2022 1454   VLDL 18.6 10/01/2021 1340   LDLCALC 64 07/12/2022 1454    Physical Exam:    VS:  BP 100/60   Pulse (!) 57   Ht 6' (1.829 m)   Wt 189 lb (85.7 kg)   BMI 25.63 kg/m     Wt Readings from Last 3 Encounters:  08/16/22 189 lb (85.7 kg)  07/12/22 189 lb 4 oz (85.8 kg)  10/01/21 191 lb (86.6 kg)     [id number] nourished, well developed in no acute distress HEENT: Normal NECK: No JVD; No carotid bruits LYMPHATICS: No lymphadenopathy CARDIAC: RRR, no murmurs, rubs, gallops RESPIRATORY:  Clear to auscultation without rales, wheezing or rhonchi  ABDOMEN: Soft, non-tender, non-distended MUSCULOSKELETAL:  No edema; No deformity  SKIN: Warm and dry NEUROLOGIC:  Alert and oriented x 3 PSYCHIATRIC:  Normal affect   ASSESSMENT:    1. Coronary artery disease involving native coronary artery of native heart without angina pectoris   2. Hyperlipidemia, unspecified hyperlipidemia type   3. Palpitations      PLAN:     CAD: Calcium score 210, 75th percentile for age/gender.  No anginal symptoms to suggest obstructive coronary disease.  LDL 125 on 04/19/2019.  Started on atorvastatin 40 mg in March 2021, LDL 49 on 08/28/2020.  Most recent LDL 64 on 07/12/2022.  Continue atorvastatin   Palpitations: Cardiac monitor shows sinus rhythm with PACs during episode of palpitations.  Reports palpitations have resolved.   Hyperlipidemia: LDL 64 on 07/12/2022, continue atorvastatin 40 mg daily   RTC in 1 year     Medication Adjustments/Labs and Tests Ordered: Current medicines are reviewed at length with the patient today.  Concerns regarding medicines are outlined above.  Orders Placed This Encounter  Procedures   EKG 12-Lead   No orders of the defined types were placed in this encounter.   Patient Instructions  Medication Instructions:  Your physician recommends that you continue on your current medications  as directed. Please refer to the Current Medication list given to you today.  *If you need a refill on your cardiac medications before your next appointment, please call your pharmacy*  Follow-Up: At [facility], you and your health needs are our priority.  As part of our continuing mission to provide you with exceptional heart care, we have created designated Provider Care Teams.  These Care Teams include your primary Cardiologist (physician) and Advanced Practice Providers (APPs -  Physician Assistants and Nurse Practitioners) who all work together to provide you with the care you need, when you need it.  We recommend signing up for the  patient portal called "MyChart".  Sign up information is provided on this After Visit Summary.  MyChart is used to connect with patients for Virtual Visits (Telemedicine).  Patients are able to view lab/test results, encounter notes, upcoming appointments, etc.  Non-urgent messages can be sent to your provider as well.   To learn more about what you can do with MyChart, go to [email or web address].    Your next appointment:   12 month(s)  Provider:   Donato Heinz, MD        Signed, Donato Heinz, MD  08/16/2022 8:26 AM    [place]

## 2022-08-16 ENCOUNTER — Encounter: Payer: Self-pay | Admitting: Cardiology

## 2022-08-16 ENCOUNTER — Ambulatory Visit: Payer: Medicare HMO | Attending: Cardiology | Admitting: Cardiology

## 2022-08-16 VITALS — BP 100/60 | HR 57 | Ht 72.0 in | Wt 189.0 lb

## 2022-08-16 DIAGNOSIS — I2510 Atherosclerotic heart disease of native coronary artery without angina pectoris: Secondary | ICD-10-CM

## 2022-08-16 DIAGNOSIS — R002 Palpitations: Secondary | ICD-10-CM

## 2022-08-16 DIAGNOSIS — E785 Hyperlipidemia, unspecified: Secondary | ICD-10-CM | POA: Diagnosis not present

## 2022-08-16 NOTE — Patient Instructions (Signed)
Medication Instructions:  Your physician recommends that you continue on your current medications as directed. Please refer to the Current Medication list given to you today.  *If you need a refill on your cardiac medications before your next appointment, please call your pharmacy*  Follow-Up: At [facility], you and your health needs are our priority.  As part of our continuing mission to provide you with exceptional heart care, we have created designated Provider Care Teams.  These Care Teams include your primary Cardiologist (physician) and Advanced Practice Providers (APPs -  Physician Assistants and Nurse Practitioners) who all work together to provide you with the care you need, when you need it.  We recommend signing up for the patient portal called "MyChart".  Sign up information is provided on this After Visit Summary.  MyChart is used to connect with patients for Virtual Visits (Telemedicine).  Patients are able to view lab/test results, encounter notes, upcoming appointments, etc.  Non-urgent messages can be sent to your provider as well.   To learn more about what you can do with MyChart, go to [email or web address].    Your next appointment:   12 month(s)  Provider:   Donato Heinz, MD

## 2022-08-27 ENCOUNTER — Other Ambulatory Visit: Payer: Self-pay | Admitting: Cardiology

## 2022-10-04 ENCOUNTER — Encounter: Payer: Self-pay | Admitting: Internal Medicine

## 2023-03-10 DIAGNOSIS — R69 Illness, unspecified: Secondary | ICD-10-CM | POA: Diagnosis not present

## 2023-04-28 DIAGNOSIS — R69 Illness, unspecified: Secondary | ICD-10-CM | POA: Diagnosis not present

## 2023-04-29 DIAGNOSIS — R69 Illness, unspecified: Secondary | ICD-10-CM | POA: Diagnosis not present

## 2023-05-26 DIAGNOSIS — R69 Illness, unspecified: Secondary | ICD-10-CM | POA: Diagnosis not present

## 2023-05-27 DIAGNOSIS — R69 Illness, unspecified: Secondary | ICD-10-CM | POA: Diagnosis not present

## 2023-05-29 DIAGNOSIS — Z0100 Encounter for examination of eyes and vision without abnormal findings: Secondary | ICD-10-CM | POA: Diagnosis not present

## 2023-05-29 DIAGNOSIS — D3131 Benign neoplasm of right choroid: Secondary | ICD-10-CM | POA: Diagnosis not present

## 2023-07-16 ENCOUNTER — Encounter: Payer: Medicare HMO | Admitting: Internal Medicine

## 2023-07-22 ENCOUNTER — Ambulatory Visit: Payer: Medicare HMO

## 2023-07-22 VITALS — Ht 72.0 in | Wt 174.0 lb

## 2023-07-22 DIAGNOSIS — Z0000 Encounter for general adult medical examination without abnormal findings: Secondary | ICD-10-CM

## 2023-07-22 NOTE — Progress Notes (Signed)
Subjective:   Jeremiah [name] is a 68 y.o. male who presents for Medicare Annual/Subsequent preventive examination.  Visit Complete: Virtual I connected with  Jeremiah [name] on 07/22/23 by a audio enabled telemedicine application and verified that I am speaking with the correct person using two identifiers.  Patient Location: Home  Provider Location: Home Office  I discussed the limitations of evaluation and management by telemedicine. The patient expressed understanding and agreed to proceed.  Vital Signs: Because this visit was a virtual/telehealth visit, some criteria may be missing or patient reported. Any vitals not documented were not able to be obtained and vitals that have been documented are patient reported.  Patient Medicare AWV questionnaire was completed by the patient on 07/21/23; I have confirmed that all information answered by patient is correct and no changes since this date.  Cardiac Risk Factors include: advanced age (>25men, >72 women);male gender     Objective:    Today's Vitals   07/22/23 1011  Weight: 174 lb (78.9 kg)  Height: 6' (1.829 m)   Body mass index is 23.6 kg/m.     07/22/2023   10:18 AM  Advanced Directives  Does Patient Have a Medical Advance Directive? Yes  Type of Estate agent of [place];Living will  Copy of Healthcare Power of Attorney in Chart? No - copy requested    Current Medications (verified) Outpatient Encounter Medications as of 07/22/2023  Medication Sig   atorvastatin (LIPITOR) 40 MG tablet TAKE ONE TABLET BY MOUTH DAILY   No facility-administered encounter medications on file as of 07/22/2023.    Allergies (verified) Patient has no known allergies.   History: Past Medical History:  Diagnosis Date   Actinic keratitis    BCC (basal cell carcinoma of skin)    Erythema nodosum    per Bx 06-2010, d/t a virus, saw ID and rheumatology   Hemorrhoids    Past Surgical History:  Procedure  Laterality Date   REFRACTIVE SURGERY     Family History  Problem Relation Age of Onset   Heart disease Father        F had a MI age 65   Hyperlipidemia Mother        F, M   Transient ischemic attack Mother        age 32   Dementia Other        F, aunts   CAD Other        GF, > 68 y/o   Colon cancer Neg Hx    Prostate cancer Neg Hx    Skin cancer Neg Hx    Social History   Socioeconomic History   Marital status: Married    Spouse name: Not on file   Number of children: 2   Years of education: Not on file   Highest education level: Bachelor's degree (e.g., BA, AB, BS)  Occupational History   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[employer])   Tobacco Use   Smoking status: Never   Smokeless tobacco: Never  Substance and Sexual Activity   Alcohol use: Yes    Comment: socially    Drug use: No   Sexual activity: Not on file  Other Topics Concern   Not on file  Social History Narrative   Goes by  "Jeremiah [name]"    Very active, outdoors    Household- pt and wife    Social Drivers of Corporate investment banker Strain: Low Risk  (07/22/2023)   Overall Physicist, medical Strain (  CARDIA)    Difficulty of Paying Living Expenses: Not hard at all  Food Insecurity: No Food Insecurity (07/22/2023)   Hunger Vital Sign    Worried About Running Out of Food in the Last Year: Never true    Ran Out of Food in the Last Year: Never true  Transportation Needs: No Transportation Needs (07/22/2023)   PRAPARE - Administrator, Civil Service (Medical): No    Lack of Transportation (Non-Medical): No  Physical Activity: Sufficiently Active (07/22/2023)   Exercise Vital Sign    Days of Exercise per Week: 4 days    Minutes of Exercise per Session: 50 min  Stress: No Stress Concern Present (07/22/2023)   [employer] of Occupational Health - Occupational Stress Questionnaire    Feeling of Stress : Only a little  Social Connections: Socially Integrated (07/22/2023)   Social Connection  and Isolation Panel [NHANES]    Frequency of Communication with Friends and Family: Three times a week    Frequency of Social Gatherings with Friends and Family: Three times a week    Attends Religious Services: More than 4 times per year    Active Member of Clubs or Organizations: Yes    Attends Engineer, structural: More than 4 times per year    Marital Status: Married    Tobacco Counseling Counseling given: Not Answered   Clinical Intake:  Pre-visit preparation completed: Yes  Pain : No/denies pain     BMI - recorded: 23.6 Nutritional Status: BMI of 19-24  Normal Nutritional Risks: None Diabetes: No  How often do you need to have someone help you when you read instructions, pamphlets, or other written materials from your doctor or pharmacy?: 1 - Never  Interpreter Needed?: No  Information entered by :: Jeremiah Mulligan LPN   Activities of Daily Living    07/22/2023   10:16 AM 07/21/2023   11:27 AM  In your present state of health, do you have any difficulty performing the following activities:  Hearing? 0 0  Vision? 0 0  Difficulty concentrating or making decisions? 0 0  Walking or climbing stairs? 0 0  Dressing or bathing? 0 0  Doing errands, shopping? 0 0  Preparing Food and eating ? N N  Using the Toilet? N N  In the past six months, have you accidently leaked urine? N N  Do you have problems with loss of bowel control? N N  Managing your Medications? N N  Managing your Finances? N N  Housekeeping or managing your Housekeeping? N N    Patient Care Team: Wanda Plump, MD as PCP - General Little Ishikawa, MD as PCP - Cardiology (Cardiology) Dorena Cookey, MD (Inactive) as Consulting Physician (Gastroenterology) Aris Lot, MD as Consulting Physician (Dermatology) Lynann Bologna, DO as Consulting Physician (Gastroenterology)  Indicate any recent Medical Services you may have received from other than [facility] providers in the past year  (date may be approximate).     Assessment:   This is a routine wellness examination for [name].  Hearing/Vision screen Hearing Screening - Comments:: Denies hearing difficulties   Vision Screening - Comments:: Wears rx glasses - up to date with routine eye exams with  [facility]   Goals Addressed               This Visit's Progress     Stay Active (pt-stated)         Depression Screen    07/22/2023   10:14  AM 07/12/2022    2:24 PM 10/01/2021    1:11 PM 08/28/2020    2:02 PM 04/16/2019    1:54 PM 04/13/2018   10:21 AM 04/11/2017    2:04 PM  PHQ 2/9 Scores  PHQ - 2 Score 0 0 0 0 0 0 0    Fall Risk    07/22/2023   10:17 AM 07/21/2023   11:27 AM 07/12/2022    2:23 PM 10/01/2021    1:11 PM 04/13/2018   10:21 AM  Fall Risk   Falls in the past year? 0 1 0 0 No  Number falls in past yr: 0 0 0 0   Injury with Fall? 0 0 0 0   Risk for fall due to : No Fall Risks      Follow up Falls prevention discussed  Falls evaluation completed Falls evaluation completed     MEDICARE RISK AT HOME: Medicare Risk at Home Any stairs in or around the home?: Yes If so, are there any without handrails?: No Home free of loose throw rugs in walkways, pet beds, electrical cords, etc?: No Adequate lighting in your home to reduce risk of falls?: Yes Life alert?: No Use of a cane, walker or w/c?: No Grab bars in the bathroom?: No Shower chair or bench in shower?: No Elevated toilet seat or a handicapped toilet?: No  TIMED UP AND GO:  Was the test performed?  No    Cognitive Function:        07/22/2023   10:18 AM  6CIT Screen  What Year? 0 points  What month? 0 points  What time? 0 points  Count back from 20 0 points  Months in reverse 0 points  Repeat phrase 0 points  Total Score 0 points    Immunizations Immunization History  Administered Date(s) Administered   Fluad Quad(high Dose 65+) 07/12/2022   Influenza,inj,Quad PF,6+ Mos 04/11/2017, 04/13/2018, 04/16/2019    Influenza-Unspecified 06/06/2023   PFIZER(Purple Top)SARS-COV-2 Vaccination 09/17/2019, 10/08/2019, 05/26/2020   PNEUMOCOCCAL CONJUGATE-20 07/12/2022   Pfizer Covid-19 Vaccine Bivalent Booster 73yrs & up 06/07/2021   Tdap 01/05/2016   Zoster Recombinant(Shingrix) 04/16/2019, 07/08/2019    TDAP status: Up to date  Flu Vaccine status: Up to date  Pneumococcal vaccine status: Up to date  Covid-19 vaccine status: Declined, Education has been provided regarding the importance of this vaccine but patient still declined. Advised may receive this vaccine at local pharmacy or Health Dept.or vaccine clinic. Aware to provide a copy of the vaccination record if obtained from local pharmacy or Health Dept. Verbalized acceptance and understanding.  Qualifies for Shingles Vaccine? Yes   Zostavax completed Yes   Shingrix Completed?: Yes  Screening Tests Health Maintenance  Topic Date Due   COVID-19 Vaccine (5 - 2024-25 season) 02/23/2023   Medicare Annual Wellness (AWV)  07/21/2024   DTaP/Tdap/Td (2 - Td or Tdap) 01/04/2026   Colonoscopy  05/08/2032   Pneumonia Vaccine 56+ Years old  Completed   INFLUENZA VACCINE  Completed   Hepatitis C Screening  Completed   Zoster Vaccines- Shingrix  Completed   HPV VACCINES  Aged Out    Health Maintenance  Health Maintenance Due  Topic Date Due   COVID-19 Vaccine (5 - 2024-25 season) 02/23/2023    Colorectal cancer screening: Type of screening: Colonoscopy. Completed 05/08/22. Repeat every 10 years     Additional Screening:  Hepatitis C Screening: does qualify; Completed 07/19/10  Vision Screening: Recommended annual ophthalmology exams for early detection of glaucoma and  other disorders of the eye. Is the patient up to date with their annual eye exam?  Yes  Who is the provider or what is the name of the office in which the patient attends annual eye exams? [facility] If pt is not established with a provider, would they like to be referred  to a provider to establish care? No .   Dental Screening: Recommended annual dental exams for proper oral hygiene    Community Resource Referral / Chronic Care Management:  CRR required this visit?  No   CCM required this visit?  No     Plan:     I have personally reviewed and noted the following in the patient's chart:   Medical and social history Use of alcohol, tobacco or illicit drugs  Current medications and supplements including opioid prescriptions. Patient is not currently taking opioid prescriptions. Functional ability and status Nutritional status Physical activity Advanced directives List of other physicians Hospitalizations, surgeries, and ER visits in previous 12 months Vitals Screenings to include cognitive, depression, and falls Referrals and appointments  In addition, I have reviewed and discussed with patient certain preventive protocols, quality metrics, and best practice recommendations. A written personalized care plan for preventive services as well as general preventive health recommendations were provided to patient.     Tillie Rung, LPN   12/12/3084   After Visit Summary: (MyChart) Due to this being a telephonic visit, the after visit summary with patients personalized plan was offered to patient via MyChart   Nurse Notes: None

## 2023-07-22 NOTE — Patient Instructions (Addendum)
Mr. Edmondson , Thank you for taking time to come for your Medicare Wellness Visit. I appreciate your ongoing commitment to your health goals. Please review the following plan we discussed and let me know if I can assist you in the future.   Referrals/Orders/Follow-Ups/Clinician Recommendations:   This is a list of the screening recommended for you and due dates:  Health Maintenance  Topic Date Due   COVID-19 Vaccine (5 - 2024-25 season) 02/23/2023   Medicare Annual Wellness Visit  07/21/2024   DTaP/Tdap/Td vaccine (2 - Td or Tdap) 01/04/2026   Colon Cancer Screening  05/08/2032   Pneumonia Vaccine  Completed   Flu Shot  Completed   Hepatitis C Screening  Completed   Zoster (Shingles) Vaccine  Completed   HPV Vaccine  Aged Out    Advanced directives: (Copy Requested) Please bring a copy of your health care power of attorney and living will to the office to be added to your chart at your convenience.  Next Medicare Annual Wellness Visit scheduled for next year: Yes

## 2023-07-31 NOTE — Progress Notes (Signed)
 Cardiology Office Note:    Date:  08/01/2023   ID:  Jeremiah [name], DOB 10-20-1955, MRN [id number]  PCP:  Amon Aloysius BRAVO, MD  Cardiologist:  Lonni LITTIE Nanas, MD  Electrophysiologist:  None   Referring MD: Amon Aloysius BRAVO, MD   Chief Complaint  Patient presents with   Coronary Artery Disease    History of Present Illness:    Jeremiah [name] is a 68 y.o. male with a hx of CAD, basal cell carcinoma, erythema nodosum who presents for follow-up.  He was referred by Dr. Amon for evaluation of palpitations, initially seen on 09/01/2019.  He reports that he has been having palpitations for years, was occurring infrequently.  Starting in December 2020, he started having more frequent palpitations.  States that during episodes his heart rhythm feels irregular.  Does not feel like heart is racing.  Typically lasts for around 15 minutes.  Denies any shortness of breath, chest pain, lightheadedness, or syncope.  Reports he has not had any palpitations since January 2021.  He is very active, reports he swims, bikes, and walks regularly.  He denies any exertional symptoms.  He has been checking his blood pressure regularly, recently has been 110s to 140s over 70s to 90s.  He had a calcium  score done on 08/31/2019, which showed calcium  score 210 (75th percentile).  No smoking history.  Father had an MI in his 40s.   Since last clinic visit, he reports he is doing well.  He retired in September.  He is exercising 4 days/week, will swim or ride stationary bike or walk for 40 to 55 minutes.  Denies any chest pain, dyspnea, lightheadedness, syncope, lower extremity edema, or palpitations.    Past Medical History:  Diagnosis Date   Actinic keratitis    BCC (basal cell carcinoma of skin)    Erythema nodosum    per Bx 06-2010, d/t a virus, saw ID and rheumatology   Hemorrhoids     Past Surgical History:  Procedure Laterality Date   REFRACTIVE SURGERY      Current Medications: Current Meds   Medication Sig   atorvastatin  (LIPITOR) 40 MG tablet TAKE ONE TABLET BY MOUTH DAILY     Allergies:   Patient has no known allergies.   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[employer])   Tobacco Use   Smoking status: Never   Smokeless tobacco: Never  Substance and Sexual Activity   Alcohol use: Yes    Comment: socially    Drug use: No   Sexual activity: Yes    Partners: Female    Comment: MARRIED  Other Topics Concern   Not on file  Social History Narrative   Goes by  [employer]    Very active, outdoors    Household- pt and wife    Social Drivers of Corporate Investment Banker Strain: Low Risk  (07/22/2023)   Overall Financial Resource Strain (CARDIA)    Difficulty of Paying Living Expenses: Not hard at all  Food Insecurity: No Food Insecurity (07/22/2023)   Hunger Vital Sign    Worried About Running Out of Food in the Last Year: Never true    Ran Out of Food in the Last Year:  Never true  Transportation Needs: No Transportation Needs (07/22/2023)   PRAPARE - Administrator, Civil Service (Medical): No    Lack of Transportation (Non-Medical): No  Physical Activity: Sufficiently Active (07/22/2023)   Exercise Vital Sign    Days of Exercise per Week: 4 days    Minutes of Exercise per Session: 50 min  Stress: No Stress Concern Present (07/22/2023)   [employer] of Occupational Health - Occupational Stress Questionnaire    Feeling of Stress : Only a little  Social Connections: Socially Integrated (07/22/2023)   Social Connection and Isolation Panel [NHANES]    Frequency of Communication with Friends and Family: Three times a week    Frequency of Social Gatherings with Friends and Family: Three times a week    Attends  Religious Services: More than 4 times per year    Active Member of Clubs or Organizations: Yes    Attends Engineer, Structural: More than 4 times per year    Marital Status: Married     Family History: The patient's family history includes CAD in an other family member; Dementia in an other family member; Heart disease in his father; Hyperlipidemia in his mother; Transient ischemic attack in his mother. There is no history of Colon cancer, Prostate cancer, or Skin cancer.  ROS:   Please see the history of present illness.     All other systems reviewed and are negative.  EKGs/Labs/Other Studies Reviewed:    The following studies were reviewed today:   EKG:   09/05/2021: Normal sinus rhythm, rate 61, RSR'  in V1 08/16/22: Sinus bradycardia, rate 57, no ST abnormality, RSR' in V1 08/01/23: Sinus bradycardia, rate 55, RSR' in V1   Recent Labs: No results found for requested labs within last 365 days.  Recent Lipid Panel    Component Value Date/Time   CHOL 127 07/12/2022 1454   CHOL 95 (L) 12/03/2019 0946   TRIG 82 07/12/2022 1454   HDL 47 07/12/2022 1454   HDL 36 (L) 12/03/2019 0946   CHOLHDL 2.7 07/12/2022 1454   VLDL 18.6 10/01/2021 1340   LDLCALC 64 07/12/2022 1454    Physical Exam:    VS:  BP 101/62   Pulse (!) 55   Ht 6' (1.829 m)   Wt 179 lb 6.4 oz (81.4 kg)   SpO2 98%   BMI 24.33 kg/m     Wt Readings from Last 3 Encounters:  08/01/23 179 lb 6.4 oz (81.4 kg)  07/22/23 174 lb (78.9 kg)  08/16/22 189 lb (85.7 kg)     HZW:Tzoo nourished, well developed in no acute distress HEENT: Normal NECK: No JVD; No carotid bruits LYMPHATICS: No lymphadenopathy CARDIAC: RRR, no murmurs, rubs, gallops RESPIRATORY:  Clear to auscultation without rales, wheezing or rhonchi  ABDOMEN: Soft, non-tender, non-distended MUSCULOSKELETAL:  No edema; No deformity  SKIN: Warm and dry NEUROLOGIC:  Alert and oriented x 3 PSYCHIATRIC:  Normal affect   ASSESSMENT:    1.  Coronary artery disease involving native coronary artery of native heart without angina pectoris   2. Palpitations   3. Hyperlipidemia, unspecified hyperlipidemia type     PLAN:    CAD: Calcium  score 210, 75th percentile for age/gender.  No anginal symptoms to suggest obstructive coronary disease.  LDL 125 on 04/19/2019.  Started on atorvastatin  40 mg in March 2021, LDL 49 on 08/28/2020.  Most recent LDL 64 on 07/12/2022.  Continue atorvastatin , will check lipid panel   Palpitations: Cardiac monitor shows  sinus rhythm with PACs during episode of palpitations.  Reports palpitations have resolved.   Hyperlipidemia: LDL 64 on 07/12/2022, continue atorvastatin  40 mg daily.  Check lipid panel   RTC in 1 year     Medication Adjustments/Labs and Tests Ordered: Current medicines are reviewed at length with the patient today.  Concerns regarding medicines are outlined above.  Orders Placed This Encounter  Procedures   Lipid panel   EKG 12-Lead   No orders of the defined types were placed in this encounter.   Patient Instructions  Medication Instructions:  Continue current medications *If you need a refill on your cardiac medications before your next appointment, please call your pharmacy*   Lab Work: Lipid Panel today If you have labs (blood work) drawn today and your tests are completely normal, you will receive your results only by: MyChart Message (if you have MyChart) OR A paper copy in the mail If you have any lab test that is abnormal or we need to change your treatment, we will call you to review the results.   Testing/Procedures: none   Follow-Up: At [facility], you and your health needs are our priority.  As part of our continuing mission to provide you with exceptional heart care, we have created designated Provider Care Teams.  These Care Teams include your primary Cardiologist (physician) and Advanced Practice Providers (APPs -  Physician Assistants and Nurse  Practitioners) who all work together to provide you with the care you need, when you need it.  We recommend signing up for the patient portal called MyChart.  Sign up information is provided on this After Visit Summary.  MyChart is used to connect with patients for Virtual Visits (Telemedicine).  Patients are able to view lab/test results, encounter notes, upcoming appointments, etc.  Non-urgent messages can be sent to your provider as well.   To learn more about what you can do with MyChart, go to [email or web address].    Your next appointment:   1 year(s)  Provider:   Lonni LITTIE Nanas, MD     Other Instructions none         Signed, Lonni LITTIE Nanas, MD  08/01/2023 8:35 AM    [facility]

## 2023-08-01 ENCOUNTER — Ambulatory Visit: Payer: Medicare HMO | Attending: Cardiology | Admitting: Cardiology

## 2023-08-01 ENCOUNTER — Encounter: Payer: Self-pay | Admitting: Cardiology

## 2023-08-01 VITALS — BP 101/62 | HR 55 | Ht 72.0 in | Wt 179.4 lb

## 2023-08-01 DIAGNOSIS — R002 Palpitations: Secondary | ICD-10-CM | POA: Diagnosis not present

## 2023-08-01 DIAGNOSIS — E785 Hyperlipidemia, unspecified: Secondary | ICD-10-CM

## 2023-08-01 DIAGNOSIS — I2510 Atherosclerotic heart disease of native coronary artery without angina pectoris: Secondary | ICD-10-CM | POA: Diagnosis not present

## 2023-08-01 LAB — LIPID PANEL
Chol/HDL Ratio: 2.4 {ratio} (ref 0.0–5.0)
Cholesterol, Total: 119 mg/dL (ref 100–199)
HDL: 50 mg/dL (ref >39)
LDL Chol Calc (NIH): 55 mg/dL (ref 0–99)
Triglycerides: 64 mg/dL (ref 0–149)
VLDL Cholesterol Cal: 14 mg/dL (ref 5–40)

## 2023-08-01 NOTE — Patient Instructions (Signed)
 Medication Instructions:  Continue current medications *If you need a refill on your cardiac medications before your next appointment, please call your pharmacy*   Lab Work: Lipid Panel today If you have labs (blood work) drawn today and your tests are completely normal, you will receive your results only by: MyChart Message (if you have MyChart) OR A paper copy in the mail If you have any lab test that is abnormal or we need to change your treatment, we will call you to review the results.   Testing/Procedures: none   Follow-Up: At [facility], you and your health needs are our priority.  As part of our continuing mission to provide you with exceptional heart care, we have created designated Provider Care Teams.  These Care Teams include your primary Cardiologist (physician) and Advanced Practice Providers (APPs -  Physician Assistants and Nurse Practitioners) who all work together to provide you with the care you need, when you need it.  We recommend signing up for the patient portal called MyChart.  Sign up information is provided on this After Visit Summary.  MyChart is used to connect with patients for Virtual Visits (Telemedicine).  Patients are able to view lab/test results, encounter notes, upcoming appointments, etc.  Non-urgent messages can be sent to your provider as well.   To learn more about what you can do with MyChart, go to [email or web address].    Your next appointment:   1 year(s)  Provider:   Lonni LITTIE Nanas, MD     Other Instructions none

## 2023-08-26 ENCOUNTER — Encounter: Payer: Self-pay | Admitting: Internal Medicine

## 2023-08-26 ENCOUNTER — Ambulatory Visit (INDEPENDENT_AMBULATORY_CARE_PROVIDER_SITE_OTHER): Payer: Medicare HMO | Admitting: Internal Medicine

## 2023-08-26 VITALS — BP 116/80 | HR 61 | Temp 97.7°F | Resp 16 | Ht 72.0 in | Wt 179.5 lb

## 2023-08-26 DIAGNOSIS — Z0000 Encounter for general adult medical examination without abnormal findings: Secondary | ICD-10-CM | POA: Diagnosis not present

## 2023-08-26 DIAGNOSIS — E785 Hyperlipidemia, unspecified: Secondary | ICD-10-CM

## 2023-08-26 DIAGNOSIS — R399 Unspecified symptoms and signs involving the genitourinary system: Secondary | ICD-10-CM | POA: Diagnosis not present

## 2023-08-26 NOTE — Patient Instructions (Addendum)
 Please see your dermatologist for a checkup.  If you need a referral let me know.  Consider a covid booster    GO TO THE LAB : Get the blood work     Please go to the front desk and schedule the following: Physical exam in 1 year     "Health Care Power of attorney" (Also know as a  "Living will" or  Advance care planning documents)  If you already have a living will or healthcare power of attorney, is recommended you bring the copy to be scanned in your chart.   The document will be available to all the doctors you see in the system.  If you are over 61 y/o and don't have the document, please read:  Advance care planning is a process that supports adults in  understanding and sharing their preferences regarding future medical care.  The patient's preferences are recorded in documents called Advance Directives and the can be modified at any time while the patient is in full mental capacity.     More information at: [email or web address]

## 2023-08-26 NOTE — Progress Notes (Unsigned)
 Subjective:    Patient ID: Jeremiah [name], male    DOB: 1955-09-15, 68 y.o.   MRN: [id number]  DOS:  08/26/2023 Type of visit - description: CPX  Here for CPX. Saw cardiology, note reviewed. Good med compliance with atorvastatin. In the last year has noted on and off LUTS: Urinary urgency and frequency, denies any difficulty urinating. No dysuria or gross hematuria.    Wt Readings from Last 3 Encounters:  08/26/23 179 lb 8 oz (81.4 kg)  08/01/23 179 lb 6.4 oz (81.4 kg)  07/22/23 174 lb (78.9 kg)    Review of Systems  Other than above, a 14 point review of systems is negative      Past Medical History:  Diagnosis Date   Actinic keratitis    BCC (basal cell carcinoma of skin)    Erythema nodosum    per Bx 06-2010, d/t a virus, saw ID and rheumatology   Hemorrhoids     Past Surgical History:  Procedure Laterality Date   REFRACTIVE SURGERY     Social History   Socioeconomic History   Marital status: Married    Spouse name: Not on file   Number of children: 2   Years of education: Not on file   Highest education level: Bachelor's degree (e.g., BA, AB, BS)  Occupational History   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[employer])  Tobacco Use   Smoking status: Never   Smokeless tobacco: Never  Substance and Sexual Activity   Alcohol use: Yes    Comment: socially    Drug use: No   Sexual activity: Yes    Partners: Female    Comment: MARRIED  Other Topics Concern   Not on file  Social History Narrative   Goes by  "M[employer]he [employer]"    Very active, outdoors    Household- pt and wife    Social Drivers of Corporate investment banker Strain: Low Risk  (07/22/2023)   Overall Financial Resource Strain (CARDIA)    Difficulty of Paying Living Expenses: Not hard at all  Food Insecurity: No Food Insecurity (07/22/2023)   Hunger Vital Sign    Worried About Running Out of Food in the Last Year: Never true    Ran Out of Food in the Last Year: Never true   Transportation Needs: No Transportation Needs (07/22/2023)   PRAPARE - Administrator, Civil Service (Medical): No    Lack of Transportation (Non-Medical): No  Physical Activity: Sufficiently Active (07/22/2023)   Exercise Vital Sign    Days of Exercise per Week: 4 days    Minutes of Exercise per Session: 50 min  Stress: No Stress Concern Present (07/22/2023)   [employer] of Occupational Health - Occupational Stress Questionnaire    Feeling of Stress : Only a little  Social Connections: Socially Integrated (07/22/2023)   Social Connection and Isolation Panel [NHANES]    Frequency of Communication with Friends and Family: Three times a week    Frequency of Social Gatherings with Friends and Family: Three times a week    Attends Religious Services: More than 4 times per year    Active Member of Clubs or Organizations: Yes    Attends Banker Meetings: More than 4 times per year    Marital Status: Married  Catering manager Violence: Not At Risk (07/22/2023)   Humiliation, Afraid, Rape, and Kick questionnaire    Fear of Current or Ex-Partner: No    Emotionally Abused: No  Physically Abused: No    Sexually Abused: No    Current Outpatient Medications  Medication Instructions   atorvastatin (LIPITOR) 40 mg, Oral, Daily       Objective:   Physical Exam BP 116/80   Pulse 61   Temp 97.7 F (36.5 C) (Oral)   Resp 16   Ht 6' (1.829 m)   Wt 179 lb 8 oz (81.4 kg)   SpO2 99%   BMI 24.34 kg/m  General: Well developed, NAD, BMI noted Neck: No  thyromegaly  HEENT:  Normocephalic . Face symmetric, atraumatic Lungs:  CTA B Normal respiratory effort, no intercostal retractions, no accessory muscle use. Heart: RRR,  no murmur.  Abdomen:  Not distended, soft, non-tender. No rebound or rigidity.   Lower extremities: no pretibial edema bilaterally DRE: External examination normal, sphincter tone normal, brown stools.  Prostate normal. Skin: Exposed  areas without rash. Not pale. Not jaundice Neurologic:  alert & oriented X3.  Speech normal, gait appropriate for age and unassisted Strength symmetric and appropriate for age.  Psych: Cognition and judgment appear intact.  Cooperative with normal attention span and concentration.  Behavior appropriate. No anxious or depressed appearing.     Assessment      Assessment Erythema nodosum, + biopsy 2012, d/t a virus, saw ID and rheumatology [facility] R leg, 03/2019 CAD:  FH CAD F (MI age 58); coronary calcium score March 2021: 210, Rx control CVRF, on statins.  PLAN: Here for CPX -Td 12-2015 - s/p shingrix x 2 - PNM 20: 2024 - had a flu shot- rec a covid vax  -CCS: Cscope  2001, negative per patient (done @ [facility]). C scope again in 2012: no polyps.  Cscope 04-2022 neg per pt, 10 years  -prostate cancer screening: Check PSA.  See comments under LUTS -Labs: CMP CBC TSH PSA UA urine culture  -Lifestyle is very healthy, he does strenuous hiking without problems.  On his own scales has lost about 7 pounds. - Healthcare POA: See AVS We also discussed the following CAD: Saw cardiology 08/01/2023. Asx  High cholesterol, last LDL was 55.  Well-controlled.  Continue atorvastatin LUTS: For the last year has on and off urinary frequency and urgency, DRE normal, check PSA, UA urine culture.   Not inclined to take medication unless symptoms get worse. BCC: Recommend to see dermatology for screening. RTC 1 year

## 2023-08-27 ENCOUNTER — Encounter: Payer: Self-pay | Admitting: Internal Medicine

## 2023-08-27 LAB — COMPREHENSIVE METABOLIC PANEL
ALT: 25 U/L (ref 0–53)
AST: 30 U/L (ref 0–37)
Albumin: 4.5 g/dL (ref 3.5–5.2)
Alkaline Phosphatase: 66 U/L (ref 39–117)
BUN: 22 mg/dL (ref 6–23)
CO2: 29 meq/L (ref 19–32)
Calcium: 9.2 mg/dL (ref 8.4–10.5)
Chloride: 102 meq/L (ref 96–112)
Creatinine, Ser: 1.07 mg/dL (ref 0.40–1.50)
GFR: 72.21 mL/min (ref >60.00)
Glucose, Bld: 85 mg/dL (ref 70–99)
Potassium: 4.6 meq/L (ref 3.5–5.1)
Sodium: 139 meq/L (ref 135–145)
Total Bilirubin: 0.8 mg/dL (ref 0.2–1.2)
Total Protein: 6.7 g/dL (ref 6.0–8.3)

## 2023-08-27 LAB — URINE CULTURE
MICRO NUMBER:: 16156611
Result:: NO GROWTH
SPECIMEN QUALITY:: ADEQUATE

## 2023-08-27 LAB — CBC WITH DIFFERENTIAL/PLATELET
Basophils Absolute: 0 10*3/uL (ref 0.0–0.1)
Basophils Relative: 0.5 % (ref 0.0–3.0)
Eosinophils Absolute: 0.4 10*3/uL (ref 0.0–0.7)
Eosinophils Relative: 5.1 % — ABNORMAL HIGH (ref 0.0–5.0)
HCT: 43.4 % (ref 39.0–52.0)
Hemoglobin: 14.3 g/dL (ref 13.0–17.0)
Lymphocytes Relative: 19.7 % (ref 12.0–46.0)
Lymphs Abs: 1.3 10*3/uL (ref 0.7–4.0)
MCHC: 32.9 g/dL (ref 30.0–36.0)
MCV: 92.9 fl (ref 78.0–100.0)
Monocytes Absolute: 0.4 10*3/uL (ref 0.1–1.0)
Monocytes Relative: 6.2 % (ref 3.0–12.0)
Neutro Abs: 4.7 10*3/uL (ref 1.4–7.7)
Neutrophils Relative %: 68.5 % (ref 43.0–77.0)
Platelets: 232 10*3/uL (ref 150.0–400.0)
RBC: 4.67 Mil/uL (ref 4.22–5.81)
RDW: 13.2 % (ref 11.5–15.5)
WBC: 6.8 10*3/uL (ref 4.0–10.5)

## 2023-08-27 LAB — URINALYSIS, ROUTINE W REFLEX MICROSCOPIC
Bilirubin Urine: NEGATIVE
Hgb urine dipstick: NEGATIVE
Ketones, ur: NEGATIVE
Leukocytes,Ua: NEGATIVE
Nitrite: NEGATIVE
RBC / HPF: NONE SEEN (ref 0–?)
Specific Gravity, Urine: 1.015 (ref 1.000–1.030)
Total Protein, Urine: NEGATIVE
Urine Glucose: NEGATIVE
Urobilinogen, UA: 0.2 (ref 0.0–1.0)
pH: 6 (ref 5.0–8.0)

## 2023-08-27 LAB — TSH: TSH: 1.34 u[IU]/mL (ref 0.35–5.50)

## 2023-08-27 LAB — PSA: PSA: 0.75 ng/mL (ref 0.10–4.00)

## 2023-08-27 NOTE — Assessment & Plan Note (Signed)
 Here for CPX -Td 12-2015 - s/p shingrix x 2 - PNM 20: 2024 - had a flu shot- rec a covid vax  -CCS: Cscope  2001, negative per patient (done @ [facility]). C scope again in 2012: no polyps.  Cscope 04-2022 neg per pt, 10 years  -prostate cancer screening: Check PSA.  See comments under LUTS -Labs: CMP CBC TSH PSA UA urine culture  -Lifestyle is very healthy, he does strenuous hiking without problems.  On his own scales has lost about 7 pounds. - Healthcare POA: See AVS

## 2023-08-27 NOTE — Assessment & Plan Note (Signed)
 Here for CPX We also discussed the following CAD: Saw cardiology 08/01/2023. Asx  High cholesterol, last LDL was 55.  Well-controlled.  Continue atorvastatin LUTS: For the last year has on and off urinary frequency and urgency, DRE normal, check PSA, UA urine culture.   Not inclined to take medication unless symptoms get worse. BCC: Recommend to see dermatology for screening. RTC 1 year

## 2023-08-28 ENCOUNTER — Encounter: Payer: Self-pay | Admitting: Internal Medicine

## 2023-09-12 DIAGNOSIS — Z0100 Encounter for examination of eyes and vision without abnormal findings: Secondary | ICD-10-CM | POA: Diagnosis not present

## 2023-09-22 ENCOUNTER — Encounter: Payer: Self-pay | Admitting: Cardiology

## 2023-09-22 ENCOUNTER — Other Ambulatory Visit: Payer: Self-pay | Admitting: Cardiology

## 2023-09-22 MED ORDER — ATORVASTATIN CALCIUM 40 MG PO TABS: 40.0000 mg | ORAL_TABLET | Freq: Every day | ORAL | 3 refills | Status: AC

## 2023-10-29 DIAGNOSIS — L821 Other seborrheic keratosis: Secondary | ICD-10-CM | POA: Diagnosis not present

## 2023-10-29 DIAGNOSIS — C44712 Basal cell carcinoma of skin of right lower limb, including hip: Secondary | ICD-10-CM | POA: Diagnosis not present

## 2023-10-29 DIAGNOSIS — D1801 Hemangioma of skin and subcutaneous tissue: Secondary | ICD-10-CM | POA: Diagnosis not present

## 2023-10-29 DIAGNOSIS — L237 Allergic contact dermatitis due to plants, except food: Secondary | ICD-10-CM | POA: Diagnosis not present

## 2023-10-29 DIAGNOSIS — Z85828 Personal history of other malignant neoplasm of skin: Secondary | ICD-10-CM | POA: Diagnosis not present

## 2023-10-29 DIAGNOSIS — D2262 Melanocytic nevi of left upper limb, including shoulder: Secondary | ICD-10-CM | POA: Diagnosis not present

## 2023-10-29 DIAGNOSIS — D225 Melanocytic nevi of trunk: Secondary | ICD-10-CM | POA: Diagnosis not present

## 2023-10-29 DIAGNOSIS — L570 Actinic keratosis: Secondary | ICD-10-CM | POA: Diagnosis not present

## 2023-10-29 DIAGNOSIS — L814 Other melanin hyperpigmentation: Secondary | ICD-10-CM | POA: Diagnosis not present

## 2024-04-28 DIAGNOSIS — H2513 Age-related nuclear cataract, bilateral: Secondary | ICD-10-CM | POA: Diagnosis not present
